# Patient Record
Sex: Female | Born: 1980 | Hispanic: No | Smoking: Former smoker
Health system: Southern US, Community
[De-identification: ages and names within clinical notes are randomized; demographics above are authoritative.]

## PROBLEM LIST (undated history)

## (undated) ENCOUNTER — Emergency Department (HOSPITAL_COMMUNITY): Disposition: A | Discharge status: Home / Self Care | Payer: Medicaid Other

## (undated) DIAGNOSIS — M5481 Occipital neuralgia: Secondary | ICD-10-CM

## (undated) DIAGNOSIS — J3489 Other specified disorders of nose and nasal sinuses: Secondary | ICD-10-CM

## (undated) DIAGNOSIS — G43909 Migraine, unspecified, not intractable, without status migrainosus: Secondary | ICD-10-CM

## (undated) DIAGNOSIS — G5600 Carpal tunnel syndrome, unspecified upper limb: Secondary | ICD-10-CM

## (undated) DIAGNOSIS — R002 Palpitations: Secondary | ICD-10-CM

## (undated) DIAGNOSIS — M436 Torticollis: Secondary | ICD-10-CM

## (undated) DIAGNOSIS — E559 Vitamin D deficiency, unspecified: Secondary | ICD-10-CM

## (undated) DIAGNOSIS — R7303 Prediabetes: Secondary | ICD-10-CM

## (undated) HISTORY — DX: Occipital neuralgia: M54.81

## (undated) HISTORY — DX: Carpal tunnel syndrome, unspecified upper limb: G56.00

## (undated) HISTORY — DX: Palpitations: R00.2

## (undated) HISTORY — DX: Migraine, unspecified, not intractable, without status migrainosus: G43.909

## (undated) HISTORY — DX: Prediabetes: R73.03

## (undated) HISTORY — DX: Other specified disorders of nose and nasal sinuses: J34.89

## (undated) HISTORY — DX: Torticollis: M43.6

## (undated) HISTORY — DX: Vitamin D deficiency, unspecified: E55.9

---

## 2002-04-08 ENCOUNTER — Inpatient Hospital Stay (HOSPITAL_COMMUNITY): Admission: AD | Admit: 2002-04-08 | Discharge: 2002-04-10 | Payer: Self-pay | Admitting: Obstetrics

## 2007-11-20 HISTORY — PX: TUBAL LIGATION: SHX77

## 2011-11-01 DIAGNOSIS — J029 Acute pharyngitis, unspecified: Secondary | ICD-10-CM | POA: Insufficient documentation

## 2011-11-02 ENCOUNTER — Emergency Department (HOSPITAL_COMMUNITY)
Admission: EM | Admit: 2011-11-02 | Discharge: 2011-11-02 | Disposition: A | Discharge status: Home / Self Care | Payer: Medicaid Other | Attending: Emergency Medicine | Admitting: Emergency Medicine

## 2011-11-02 ENCOUNTER — Encounter: Payer: Self-pay | Admitting: *Deleted

## 2011-11-02 DIAGNOSIS — J029 Acute pharyngitis, unspecified: Secondary | ICD-10-CM

## 2011-11-02 MED ORDER — IBUPROFEN 800 MG PO TABS: 800.0000 mg | ORAL_TABLET | Freq: Three times a day (TID) | ORAL | Status: AC

## 2011-11-02 MED ORDER — AMOXICILLIN 500 MG PO CAPS
1000.0000 mg | ORAL_CAPSULE | Freq: Once | ORAL | Status: AC
  Administered 2011-11-02: 1000 mg via ORAL
  Filled 2011-11-02: qty 2

## 2011-11-02 MED ORDER — PREDNISONE 20 MG PO TABS
40.0000 mg | ORAL_TABLET | Freq: Once | ORAL | Status: AC
  Administered 2011-11-02: 40 mg via ORAL
  Filled 2011-11-02: qty 2

## 2011-11-02 MED ORDER — AMOXICILLIN 500 MG PO CAPS: 500.0000 mg | ORAL_CAPSULE | Freq: Three times a day (TID) | ORAL | Status: AC

## 2011-11-02 NOTE — ED Provider Notes (Signed)
History     CSN: [id number] Arrival date & time: 11/02/2011 12:02 AM   First MD Initiated Contact with Patient 11/02/11 0253      Chief Complaint  Patient presents with  . Sore Throat   patient has had sore throat for 2 days. Temperature of 99 in triage. Patient has no other medical problems. She is also had a mild cough. No chest pain. No vomiting. No other problems at this time  (Consider location/radiation/quality/duration/timing/severity/associated sxs/prior treatment) HPI  History reviewed. No pertinent past medical history.  History reviewed. No pertinent past surgical history.  History reviewed. No pertinent family history.  History  Substance Use Topics  . Smoking status: Never Smoker   . Smokeless tobacco: Not on file  . Alcohol Use: Yes    OB History    Grav Para Term Preterm Abortions TAB SAB Ect Mult Living                  Review of Systems  All other systems reviewed and are negative.    Allergies  Macrobid  Home Medications  No current outpatient prescriptions on file.  BP 127/80  Pulse 80  Temp(Src) 99 F (37.2 C) (Oral)  Resp 20  SpO2 100%  Physical Exam  Nursing note and vitals reviewed. Constitutional: She appears well-developed and well-nourished. No distress.  HENT:  Head: Normocephalic.       Tonsils are beefy and red. Trace exudate on the right tonsillar pillar. Uvula is midline. There is no abscess or odor. Mucous membranes are moist. No adenopathy.  Eyes: Pupils are equal, round, and reactive to light.  Cardiovascular: Normal heart sounds.   Pulmonary/Chest: Breath sounds normal.  Abdominal: Soft.  Musculoskeletal: Normal range of motion.  Neurological: She is alert.  Skin: Skin is warm and dry.    ED Course  Procedures (including critical care time)   Labs Reviewed  RAPID STREP SCREEN   No results found.   No diagnosis found.    MDM  Pt is seen and examined;  Initial history and physical completed.  Will  follow.          Thos Matsumoto A. Patrica Duel, MD 11/02/11 [phone]

## 2011-11-02 NOTE — ED Notes (Signed)
Pt in c/o sore throat x2 days, increased swelling today

## 2012-01-08 ENCOUNTER — Emergency Department (HOSPITAL_COMMUNITY)
Admission: EM | Admit: 2012-01-08 | Discharge: 2012-01-09 | Disposition: A | Discharge status: Home / Self Care | Payer: Self-pay | Attending: Emergency Medicine | Admitting: Emergency Medicine

## 2012-01-08 DIAGNOSIS — J029 Acute pharyngitis, unspecified: Secondary | ICD-10-CM | POA: Insufficient documentation

## 2012-01-09 ENCOUNTER — Encounter (HOSPITAL_COMMUNITY): Payer: Self-pay | Admitting: Adult Health

## 2012-01-09 LAB — RAPID STREP SCREEN (MED CTR MEBANE ONLY): Streptococcus, Group A Screen (Direct): NEGATIVE

## 2012-01-09 MED ORDER — HYDROCODONE-ACETAMINOPHEN 7.5-325 MG/15ML PO SOLN: 15.0000 mL | Freq: Three times a day (TID) | ORAL | Status: AC | PRN

## 2012-01-09 MED ORDER — NAPROXEN 500 MG PO TABS: 500.0000 mg | ORAL_TABLET | Freq: Two times a day (BID) | ORAL | Status: DC

## 2012-01-09 NOTE — Discharge Instructions (Signed)
Your strep test is normal, please take Naprosyn or ibuprofen for pain, hydrocodone suspension for severe pain, drink plenty of fluids and followup with her Dr. as needed.  RESOURCE GUIDE  Dental Problems  Patients with Medicaid: [facility] [phone] [address]                                           [phone] [address] Phone:  [phone]                                                  Phone:  [phone]  If unable to pay or uninsured, contact:  [facility] or [facility]. to become qualified for the adult dental clinic.  Chronic Pain Problems Contact [facility]  [phone] Patients need to be referred by their primary care doctor.  Insufficient Money for Medicine Contact [employer]:  call "211" or [facility] 410-051-6191.  No Primary Care Doctor Call [facility]  [phone] Other agencies that provide inexpensive medical care    [facility]  [phone]    [facility]  [phone]    [facility]  [phone]    [facility]  [phone]    [facility]  [phone]    [facility]  [phone]  Psychological Services [facility]  [phone] [facility] Services  [phone] [facility]   [phone] (emergency services [phone])  Substance Abuse Resources [facility]  [phone] [facility] [phone] [facility] [place] [phone] Floydene Flock [phone] Residential & Outpatient Substance Abuse Program  [phone]  Abuse/Neglect [facility] Child Abuse Hotline [phone] [facility] Child Abuse Hotline [phone] (After Hours)  Emergency Shelter [facility] [phone]  Maternity Homes [facility] [place] [facility] [place] 6032099364 [facility] [phone]  MRSA Hotline #:   404 072 3315    [facility]  Resources  [facility] [place]     [employer]                          [facility] Dept. [address] [place]                       [address]      [address]  [place]                                                Cristobal Goldmann Phone:  [phone]                                   Phone:  [phone]                 Phone:  [phone]  [facility] Phone:  [phone]  [facility] Child Abuse Hotline 878-404-7626 [phone] (After Hours)

## 2012-01-09 NOTE — ED Notes (Signed)
Sore throat began yesterday associated with cough. Denies rhinorrhea, denies nausea. Redness and exudate noted.

## 2012-01-09 NOTE — ED Provider Notes (Signed)
History     CSN: [id number]  Arrival date & time 01/08/12  2348   First MD Initiated Contact with Patient 01/09/12 [phone]      Chief Complaint  Patient presents with  . Sore Throat    (Consider location/radiation/quality/duration/timing/severity/associated sxs/prior treatment) HPI Comments: 31 year old female with a history of approximately 24 hours of sore throat. It is constant, mild to moderate, worse with swallowing and not associated with fevers chills nausea vomiting. She does have a mild cough. She does not have a known sick contacts. She has had strep pharyngitis in the past year. She is in no medications prior to arrival and has no other major past medical history.  Patient is a 31 y.o. female presenting with pharyngitis. The history is provided by the patient.  Sore Throat    History reviewed. No pertinent past medical history.  History reviewed. No pertinent past surgical history.  History reviewed. No pertinent family history.  History  Substance Use Topics  . Smoking status: Never Smoker   . Smokeless tobacco: Not on file  . Alcohol Use: Yes    OB History    Grav Para Term Preterm Abortions TAB SAB Ect Mult Living                  Review of Systems  Constitutional: Negative for fever and chills.  HENT: Positive for sore throat.   Respiratory: Positive for cough.     Allergies  Macrobid  Home Medications   Current Outpatient Rx  Name Route Sig Dispense Refill  . HYDROCODONE-ACETAMINOPHEN 7.5-325 MG/15ML PO SOLN Oral Take 15 mLs by mouth every 8 (eight) hours as needed for pain. 120 mL 0  . NAPROXEN 500 MG PO TABS Oral Take 1 tablet (500 mg total) by mouth 2 (two) times daily with a meal. 30 tablet 0    BP 122/77  Pulse 90  Temp(Src) 98.8 F (37.1 C) (Oral)  Resp 18  SpO2 99%  Physical Exam  Nursing note and vitals reviewed. Constitutional: She appears well-developed and well-nourished. No distress.  HENT:  Head: Normocephalic and  atraumatic.  Mouth/Throat: Oropharynx is clear and moist. No oropharyngeal exudate.       Bilateral tonsillar hypertrophy and erythema with mild scattered exudate no asymmetry, uvula is midline  Eyes: Conjunctivae are normal. No scleral icterus.  Neck: Normal range of motion. Neck supple. No thyromegaly present.  Cardiovascular: Normal rate and regular rhythm.   Pulmonary/Chest: Effort normal and breath sounds normal.  Lymphadenopathy:    She has no cervical adenopathy.  Neurological: She is alert.  Skin: Skin is warm and dry. No rash noted. She is not diaphoretic.    ED Course  Procedures (including critical care time)   Labs Reviewed  RAPID STREP SCREEN   No results found.   1. Pharyngitis       MDM  No lymphadenopathy, no fever, no tachycardia, no change in phonation, swallowing with minimal difficulty. Strep pharyngitis rule out with bedside rapid strep, patient informed of results and encouraged to followup closely. She declines medications in the emergency department  Discharge Prescriptions include:  #1 Naprosyn #2 hydrocodone suspension        Vida Roller, MD 01/09/12 [phone]

## 2012-06-20 ENCOUNTER — Emergency Department (HOSPITAL_COMMUNITY): Payer: Self-pay

## 2012-06-20 ENCOUNTER — Emergency Department (HOSPITAL_COMMUNITY)
Admission: EM | Admit: 2012-06-20 | Discharge: 2012-06-21 | Disposition: A | Discharge status: Home / Self Care | Payer: Self-pay | Attending: Emergency Medicine | Admitting: Emergency Medicine

## 2012-06-20 ENCOUNTER — Encounter (HOSPITAL_COMMUNITY): Payer: Self-pay | Admitting: Emergency Medicine

## 2012-06-20 DIAGNOSIS — M25539 Pain in unspecified wrist: Secondary | ICD-10-CM | POA: Insufficient documentation

## 2012-06-20 NOTE — ED Notes (Signed)
Patient states that she fell down the stairs and has pain to her left wist. Swelling noted. The patient reports that no LOC or injuries other than wrist

## 2012-06-21 MED ORDER — IBUPROFEN 800 MG PO TABS
800.0000 mg | ORAL_TABLET | Freq: Once | ORAL | Status: AC
  Administered 2012-06-21: 800 mg via ORAL
  Filled 2012-06-21: qty 1

## 2012-06-21 MED ORDER — OXYCODONE-ACETAMINOPHEN 5-325 MG PO TABS: 1.0000 | ORAL_TABLET | ORAL | Status: AC | PRN

## 2012-06-21 NOTE — ED Provider Notes (Signed)
History     CSN: [id number]  Arrival date & time 06/20/12  2159   First MD Initiated Contact with Patient 06/20/12 2359      Chief Complaint  Patient presents with  . Fall  . Wrist Pain    (Consider location/radiation/quality/duration/timing/severity/associated sxs/prior treatment) Patient is a 31 y.o. female presenting with fall and wrist pain. The history is provided by the patient. No language interpreter was used.  Fall The accident occurred 3 to 5 hours ago. The fall occurred while walking (fell down wet steps and landed on L wrist). Distance fallen: 3 steps. She landed on a hard floor. There was no blood loss. The point of impact was the left wrist. The pain is present in the left wrist. The pain is at a severity of 8/10. The pain is moderate. She was ambulatory at the scene. There was no entrapment after the fall. There was no drug use involved in the accident. There was no alcohol use involved in the accident. Pertinent negatives include no fever, no numbness, no nausea, no vomiting and no loss of consciousness. The symptoms are aggravated by flexion. She has tried acetaminophen for the symptoms. The treatment provided mild relief.  Wrist Pain Pertinent negatives include no fever, nausea, numbness or vomiting.  31yo female reports falling down 3 stairs landing on her L wrist.  Pain with ROM.  No prior injuries to the wrist.  +CMS .  No deformity.  History reviewed. No pertinent past medical history.  History reviewed. No pertinent past surgical history.  History reviewed. No pertinent family history.  History  Substance Use Topics  . Smoking status: Never Smoker   . Smokeless tobacco: Not on file  . Alcohol Use: Yes    OB History    Grav Para Term Preterm Abortions TAB SAB Ect Mult Living                  Review of Systems  Constitutional: Negative.  Negative for fever.  HENT: Negative.   Eyes: Negative.   Respiratory: Negative.   Cardiovascular: Negative.     Gastrointestinal: Negative.  Negative for nausea and vomiting.  Musculoskeletal:       Left wrist pain  Neurological: Negative.  Negative for loss of consciousness and numbness.  Psychiatric/Behavioral: Negative.   All other systems reviewed and are negative.    Allergies  Nitrofurantoin monohyd macro and Nitrofurantoin monohyd macro  Home Medications   Current Outpatient Rx  Name Route Sig Dispense Refill  . NAPROXEN 500 MG PO TABS Oral Take 1 tablet (500 mg total) by mouth 2 (two) times daily with a meal. 30 tablet 0    BP 132/77  Pulse 81  Temp 98.7 F (37.1 C) (Oral)  Resp 16  SpO2 100%  LMP 05/29/2012  Physical Exam  Nursing note and vitals reviewed. Constitutional: She is oriented to person, place, and time. She appears well-developed and well-nourished.  HENT:  Head: Normocephalic and atraumatic.  Eyes: Conjunctivae and EOM are normal. Pupils are equal, round, and reactive to light.  Neck: Normal range of motion. Neck supple.  Cardiovascular: Normal rate.   Pulmonary/Chest: Effort normal.  Abdominal: Soft.  Genitourinary: Vaginal discharge: L wrist tenderness.  Musculoskeletal: Normal range of motion. She exhibits tenderness. She exhibits no edema.       L wrist tenderness no deformity.  +cms  Neurological: She is alert and oriented to person, place, and time. She has normal reflexes.  Skin: Skin is warm and dry.  Psychiatric: She has a normal mood and affect.    ED Course  Procedures (including critical care time)  Labs Reviewed - No data to display Dg Wrist Complete Left  06/20/2012  *RADIOLOGY REPORT*  Clinical Data: Fall with left wrist pain.  LEFT WRIST - COMPLETE 3+ VIEW  Comparison: None  Findings: No evidence of acute fracture, subluxation or dislocation identified.  No radio-opaque foreign bodies are present.  No focal bony lesions are noted.  The joint spaces are unremarkable.  IMPRESSION: No evidence of acute bony abnormality.  Original Report  Authenticated By: Rosendo Gros, M.D.     No diagnosis found.    MDM  L wrist pain after fall.  +cms.  Films negative for fx.  Wrist splint applied.  Ice and elevate x 24 hours.  Follow up with ortho if not better.  Percocet and ibuprofen for pain.  Return if loss of sensation or severe pain.         Remi Haggard, NP 06/22/12 1343

## 2012-06-23 NOTE — ED Provider Notes (Signed)
Medical screening examination/treatment/procedure(s) were performed by non-physician practitioner and as supervising physician I was immediately available for consultation/collaboration.   Delaila Nand, MD 06/23/12 1446 

## 2013-07-15 ENCOUNTER — Ambulatory Visit: Payer: Self-pay | Admitting: Obstetrics & Gynecology

## 2013-08-03 ENCOUNTER — Ambulatory Visit: Payer: Self-pay | Admitting: Obstetrics & Gynecology

## 2013-09-17 ENCOUNTER — Ambulatory Visit: Payer: Medicaid Other | Admitting: Obstetrics

## 2013-10-22 ENCOUNTER — Ambulatory Visit (HOSPITAL_COMMUNITY)
Admission: RE | Admit: 2013-10-22 | Discharge: 2013-10-22 | Disposition: A | Discharge status: Home / Self Care | Payer: Medicaid Other | Source: Ambulatory Visit | Attending: Internal Medicine | Admitting: Internal Medicine

## 2013-10-22 ENCOUNTER — Other Ambulatory Visit (HOSPITAL_COMMUNITY): Payer: Self-pay | Admitting: Internal Medicine

## 2013-10-22 DIAGNOSIS — M79641 Pain in right hand: Secondary | ICD-10-CM

## 2013-10-22 DIAGNOSIS — M79609 Pain in unspecified limb: Secondary | ICD-10-CM | POA: Insufficient documentation

## 2013-11-25 ENCOUNTER — Emergency Department (HOSPITAL_COMMUNITY): Payer: Medicaid Other

## 2013-11-25 ENCOUNTER — Encounter (HOSPITAL_COMMUNITY): Payer: Self-pay | Admitting: Emergency Medicine

## 2013-11-25 ENCOUNTER — Emergency Department (HOSPITAL_COMMUNITY)
Admission: EM | Admit: 2013-11-25 | Discharge: 2013-11-25 | Disposition: A | Discharge status: Home / Self Care | Payer: Medicaid Other | Attending: Emergency Medicine | Admitting: Emergency Medicine

## 2013-11-25 DIAGNOSIS — R5383 Other fatigue: Secondary | ICD-10-CM

## 2013-11-25 DIAGNOSIS — X503XXA Overexertion from repetitive movements, initial encounter: Secondary | ICD-10-CM | POA: Insufficient documentation

## 2013-11-25 DIAGNOSIS — Y9389 Activity, other specified: Secondary | ICD-10-CM | POA: Insufficient documentation

## 2013-11-25 DIAGNOSIS — R5381 Other malaise: Secondary | ICD-10-CM | POA: Insufficient documentation

## 2013-11-25 DIAGNOSIS — R52 Pain, unspecified: Secondary | ICD-10-CM | POA: Insufficient documentation

## 2013-11-25 DIAGNOSIS — G8929 Other chronic pain: Secondary | ICD-10-CM | POA: Insufficient documentation

## 2013-11-25 DIAGNOSIS — M25511 Pain in right shoulder: Secondary | ICD-10-CM

## 2013-11-25 DIAGNOSIS — Y9289 Other specified places as the place of occurrence of the external cause: Secondary | ICD-10-CM | POA: Insufficient documentation

## 2013-11-25 DIAGNOSIS — S46909A Unspecified injury of unspecified muscle, fascia and tendon at shoulder and upper arm level, unspecified arm, initial encounter: Secondary | ICD-10-CM | POA: Insufficient documentation

## 2013-11-25 DIAGNOSIS — Y990 Civilian activity done for income or pay: Secondary | ICD-10-CM | POA: Insufficient documentation

## 2013-11-25 DIAGNOSIS — X58XXXA Exposure to other specified factors, initial encounter: Secondary | ICD-10-CM

## 2013-11-25 DIAGNOSIS — S4980XA Other specified injuries of shoulder and upper arm, unspecified arm, initial encounter: Secondary | ICD-10-CM | POA: Insufficient documentation

## 2013-11-25 MED ORDER — MELOXICAM 7.5 MG PO TABS: 15.0000 mg | ORAL_TABLET | Freq: Every day | ORAL | Status: DC

## 2013-11-25 MED ORDER — KETOROLAC TROMETHAMINE 60 MG/2ML IM SOLN
60.0000 mg | Freq: Once | INTRAMUSCULAR | Status: AC
  Administered 2013-11-25: 60 mg via INTRAMUSCULAR
  Filled 2013-11-25: qty 2

## 2013-11-25 NOTE — ED Notes (Signed)
Pt states that she has repetitive motion at work and has had trouble with her right arm and shoulder.  With pain down the back of her arm.  States pain in this arm x 1.5 yrs.

## 2013-11-25 NOTE — ED Provider Notes (Signed)
CSN: [id number]     Arrival date & time 11/25/13  1014 History   First MD Initiated Contact with Patient 11/25/13 1028     Chief Complaint  Patient presents with  . Arm Pain   (Consider location/radiation/quality/duration/timing/severity/associated sxs/prior Treatment) Patient is a 33 y.o. female presenting with arm pain.  Arm Pain Associated symptoms include arthralgias and weakness (Right arm secondary to pain). Pertinent negatives include no neck pain or rash.   33 yo female presents with acute on chronic RIGHT shoulder pain. 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 Patient reports hx of repetitive over the head arm activities at work. Patient reports 1 year hx of chronic RIGHT shoulder pain worse with activities above shoulder height. Patient reports recent exacerbation since Monday with associated hand swelling. Patient [facility] when she stepped in a whole on Tuesday. Patient pain described as heaviness and weakness secondary to the pain, with sharp pains if she exceeds 90 degrees shoulder flexion. Patient denies radiation. Takes meloxicam intermittently for pain, has taken once in the past week. Denies fever/chills, admits to intermittent numbness of skin overlying triceps area, that has resolved today. Denies color change, denies past surgeries.  History reviewed. No pertinent past medical history. No past surgical history on file. No family history on file. History  Substance Use Topics  . Smoking status: Never Smoker   . Smokeless tobacco: Not on file  . Alcohol Use: Yes   OB History   Grav Para Term Preterm Abortions TAB SAB Ect Mult Living                 Review of Systems  Musculoskeletal: Positive for arthralgias. Negative for back pain, gait problem, neck pain and neck stiffness.  Skin: Negative for color change, pallor, rash and wound.  Neurological: Positive for weakness (Right arm secondary to pain).  All other systems reviewed and are  negative.    Allergies  Nitrofurantoin monohyd macro and Nitrofurantoin monohyd macro  Home Medications   Current Outpatient Rx  Name  Route  Sig  Dispense  Refill  . meloxicam (MOBIC) 7.5 MG tablet   Oral   Take 2 tablets (15 mg total) by mouth daily.   30 tablet   0    BP 148/79  Pulse 82  Temp(Src) 98.8 F (37.1 C) (Oral)  Resp 16  SpO2 100%  LMP 10/25/2013 Physical Exam  Nursing note and vitals reviewed. Constitutional: She is oriented to person, place, and time. She appears well-developed and well-nourished. No distress.  HENT:  Head: Normocephalic and atraumatic.  Eyes: Conjunctivae and EOM are normal.  Neck: Normal range of motion. Neck supple.  Cardiovascular: Normal rate and regular rhythm.  Exam reveals no gallop and no friction rub.   No murmur heard. Good radial pulses bilaterally and equal.   Pulmonary/Chest: Effort normal and breath sounds normal. No respiratory distress. She has no wheezes. She has no rales.  Musculoskeletal: Normal range of motion.  RIGHT SHOULDER/Upper extremity: Positive impingement signs. No sulcus sign noted with longitudinal traction of RIght  Arm. Pain with resisted biceps flexion. No pain with resisted supination at wrist. Proximal biceps tendon tenderness to palpation. No bony tenderness of shoulder girdle. Pain with active and passive ROM above shoulder height. Patient has pain with placing right hand behind her back. No evident bruising or swelling noted.   Cervical midline tenderness on exam rated 5/10. No paraspinal tenderness. Normal ROM of neck/head   Neurological: She is alert and oriented  to person, place, and time.  Good sensation of hands bilaterally and upper arm bilaterally.   Skin: Skin is warm and dry. She is not diaphoretic.  Psychiatric: She has a normal mood and affect. Her behavior is normal.    ED Course  Procedures (including critical care time) Labs Review Labs Reviewed - No data to display Imaging  Review Dg Cervical Spine Complete  11/25/2013   CLINICAL DATA:  Chronic neck and right shoulder pain  EXAM: CERVICAL SPINE  4+ VIEWS  COMPARISON:  None.  FINDINGS: Normal alignment. Negative for fracture. C5-6 degenerative changes and spondylosis. Endplate bony spurring noted anteriorly at this level. Preserved vertebral body heights and disc spaces. Facets aligned. Foramina appear patent. Intact odontoid. Lung apices are clear.  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   Electronically Signed   By: Ruel Favors M.D.   On: 11/25/2013 11:54   Dg Shoulder Right  11/25/2013   CLINICAL DATA:  Chronic, worsening right shoulder pain.  EXAM: RIGHT SHOULDER - 2+ VIEW  COMPARISON:  None.  FINDINGS: No acute bony or joint abnormality is identified. Mild acromioclavicular degenerative change is noted. Imaged lung parenchyma and ribs are unremarkable.  IMPRESSION: No acute finding. Mild appearing acromioclavicular degenerative disease.   Electronically Signed   By: Drusilla Kanner M.D.   On: 11/25/2013 11:53   a EKG Interpretation   None       MDM   1. Shoulder pain, right   Plain films show Mild C5-C6 degenerative change. Mild AC joint degenerative disease.   Suspect patient may have possible RIGHT proximal biceps tendonitis with associated subacromial bursitis. Due to complexity of shoulder joint and patient's recent FOOSH. Recommend patient follow up with Orthopedics. Advise call today to make appointment ASAP. May need further testing. Patient confirms understanding. Discharged in good condition.      Meds given in ED:  Medications  ketorolac (TORADOL) injection 60 mg (60 mg Intramuscular Given 11/25/13 1143)    Discharge Medication List as of 11/25/2013 10:52 AM    START taking these medications   Details  meloxicam (MOBIC) 7.5 MG tablet Take 2 tablets (15 mg total) by mouth daily., Starting 11/25/2013, Until Discontinued, Print         Rudene Anda, PA-C 11/26/13 1308

## 2013-11-25 NOTE — Discharge Instructions (Signed)
Call Orthopedic office today to schedule follow up for acute on chronic RIGHT shoulder pain, contact information provided above. Start taking Meloxicam daily as prescribed. Take medication with food to prevent upset stomach. Recommend rest, elevation on pillow, with daily ice to affected joint following daily activities.    Emergency Department Resource Guide 1) Find a Doctor and Pay Out of Pocket Although you won't have to find out who is covered by your insurance plan, it is a good idea to ask around and get recommendations. You will then need to call the office and see if the doctor you have chosen will accept you as a new patient and what types of options they offer for patients who are self-pay. Some doctors offer discounts or will set up payment plans for their patients who do not have insurance, but you will need to ask so you aren't surprised when you get to your appointment.  2) Contact Your Local Health Department Not all health departments have doctors that can see patients for sick visits, but many do, so it is worth a call to see if yours does. If you don't know where your local health department is, you can check in your phone book. The CDC also has a tool to help you locate your state's health department, and many state websites also have listings of all of their local health departments.  3) Find a Walk-in Clinic If your illness is not likely to be very severe or complicated, you may want to try a walk in clinic. These are popping up all over the country in pharmacies, drugstores, and shopping centers. They're usually staffed by nurse practitioners or physician assistants that have been trained to treat common illnesses and complaints. They're usually fairly quick and inexpensive. However, if you have serious medical issues or chronic medical problems, these are probably not your best option.  No Primary Care Doctor: - Call Health Connect at  [phone] - they can help you locate a  primary care doctor that  accepts your insurance, provides certain services, etc. - Physician Referral Service- [phone]  Chronic Pain Problems: Organization         Address  Phone   Notes  [facility]  [phone]) [phone] Patients need to be referred by their primary care doctor.   Medication Assistance: Organization         Address  Phone   Notes  [facility] [address] [place], [place] [address] [phone]) [phone] --Must be a resident of [facility] [place] -- Must have NO insurance coverage whatsoever (no Medicaid/ Medicare, etc.) -- The pt. MUST have a primary care doctor that directs their care regularly and follows them in the community   Med[facility]ist  [phone]   [employer]  [phone]    Agencies that provide inexpensive medical care: Organization         Address  Phone   Notes  [facility]  [phone]) [phone]   [facility]    [phone]) [phone]   [facility] [address] [place], [place] [address] [phone]) [phone]   [facility] [address], [place] [phone]) [phone]   [facility]anned [facility]    [phone]   [facility]    [phone]) [phone]   [facility]  [address], [place] Phone:  [phone](336) [phone], Fax:  [phone]) [phone] Hours of Operation:  9 am -  6 pm, M-F.  Also accepts Medicaid/Medicare and self-pay.  [facility] for [place], [address], [place] Phone: [phone], Fax: [phone]. Hours of Operation:  8:30 am - 5:30 pm, M-F.  Also accepts Medicaid and self-pay.  [facility] [place] [address] [place] Phone: [phone]   [place], [place], [place] [phone], Ext. [phone] Mondays & Thursdays: 7-9 AM.  First 15 patients are seen on a first come, first serve basis.    [place]  Providers:  Organization         Address  Phone   Notes  [facility] [address],  [phone] Also accepts self-pay patients.  [facility] [id number] [place], [place]  [phone]   [place], [address], [place] [phone]   [facility] [address], [place] [phone]   Lucianne Lei [address], [place]   [phone] Only accepts [place] Access [place] patients after they have their name applied to their card.   Self-Pay (no insurance) in [facility]:  Organization         Address  Phone   Notes  Sickle Cell Patients, [facility] [place] [phone]   [facility] [phone]   [facility] [place]  [place], [place], [place] [phone]   [facility]/Dr. Osei-Bonsu  [address], [place] or [address], [place] [phone] Phone number for both [place] and [place] locations is the same.  [facility] [address] [place] [phone]   [facility] [address] [place] or [address] [phone]   [facility] [address] [place] [phone], phone; [phone], fax Sees patients 1st and 3rd Saturday of every month.  Must not qualify for public or private insurance (i.e. Medicaid, Medicare, [place] Health Choice, Veterans' Benefits)  Household income should be no more than 200% of the poverty level The clinic cannot treat you if you are pregnant or think you are pregnant  Sexually transmitted diseases are not treated at the clinic.    Dental Care: Organization         Address  Phone  Notes  [facility] Department of [facility] [phone] Accepts children up to age 12 who are enrolled in [place] or [employer]; pregnant women with a Medicaid card; and children who have applied for Medicaid or [place] Health Choice, but were declined, whose parents can pay a reduced fee at time of service.  [facility] of [facility]  [address], [place] [phone] Accepts children up to age 3 who are enrolled in [place] or [place]; pregnant women with a Medicaid card; and children who have applied for Medicaid or [place] Health Choice, but were declined, whose parents can pay a reduced fee at time of service.  [facility]  [place] [phone] Patients are seen by appointment only. Walk-ins are not accepted. [place] will see patients 73 years of age and  older. Monday - Tuesday (8am-5pm) Most Wednesdays (8:30-5pm) $30 per visit, cash only  [facility] Adult [employer] PROGRAM  [address], [facility] [phone] Patients are seen by appointment only. Walk-ins are not accepted. [place] will see patients 36 years of age and older. One Wednesday Evening (Monthly: Volunteer Based).  $30 per visit, cash only  [place]  [phone] for adults; Children under age 34, call Graduate Pediatric Dentistry at [phone]. Children aged 22-14, please call [phone] to request a pediatric application.  Dental services are provided in all areas of dental care including fillings, crowns and bridges, complete and partial dentures, implants, gum treatment, root canals, and extractions. Preventive care is also provided. Treatment is provided to both adults and children. Patients are selected via a lottery and there is often a waiting list.   [facility] [address] [place]  [phone] [email or web address]   [facility]  [address] [place], [place] [phone], Ext. [phone] Second and Fourth Thursday of each month, opens at 6:30 AM; Clinic ends at 9 AM.  Patients are seen on a first-come first-served basis, and a limited number are seen during each clinic.   [facility]  [address] Hillard Danker [place], [place] [phone]   Eligibility Requirements You must have lived in [place], [place], or [place] counties for at least the last three months.   You cannot be eligible for state or federal sponsored [employer], including [employer], [place], or [employer].   You generally cannot be eligible for healthcare insurance through your employer.    How to apply: Eligibility screenings are held every Tuesday and Wednesday afternoon from 1:00 pm until 4:00 pm. You do not need an appointment for the interview!  [facility] [address] [place], [place]   [place]  [place] Department  [place]  [phone]    Behavioral Health Resources in the Community: Intensive Outpatient Programs Organization         Address  Phone  Notes  [place]. [address], [place], [place] [phone]   [facility] [address] [place], [place]   ADS: Alcohol & Drug Svcs [address], [place], [place]   [place] [address],  [place], [place] or [phone]   Substance Abuse Resources Organization         Address  Phone  Notes  Alcohol and Drug Services  [phone]   [place]  [phone]   [facility] [place]   Chinita Pester  [phone]   Residential & Outpatient Substance Abuse Program  [phone]   Psychological Services Organization         Address  Phone  Notes  [facility]  [place]  [phone]   [place] [address] [place] [phone] or [phone]    Mobile Crisis Teams Organization         Address  Phone  Notes  [facility], Mobile Crisis Care Unit  [phone]   [facility]  [address] [place], [place]   [facility] [address], [place] [phone]    Self-Help/Support Groups Organization         Address  Phone             Notes  [facility]oc[facility] of [place] - variety of support groups  [phone] Call for more information  Narcotics Anonymous (NA), [facility]aring [facility] [address], [place]  2 meetings at this location   Statisticianesidential Treatment Programs Organization         Address  Phone  Notes  [facility] [address],    [place]  [phone]   [facility]  [address], [place] [phone], [place], [place] [phone]   [facility] [address], [place] [phone] Admissions: 8am-3pm M-F  [facility] [address]    [place], [place] [phone]   [facility] [address], [place], [place] [phone]   [facility] [address]  [place], [place] [phone]   [facility] - Intensive Outpatient [address], [place], [place] [phone]   [facility] ([facility]iction Recovery [facility] [address]  [place], [place] [phone] or [phone]   [facility] Services (RTS) [address] [place], [place] [phone] Accepts Medicaid  [facility] [address]  [place] [phone] Substance Abuse/Addiction Treatment   [facility] Behavioral Health Resources Organization         Address  Phone  Notes  [facility]  [phone]) [phone]   Angie FavaJulie Brannon, PhD [address], [address] [place], [place]   [phone]) [phone] or [phone]) [phone]    [facility]   [address] [place], [place] [phone]) [phone]   [facility] [address], [place], [place] [phone]) [phone] Insurance/Medicaid/sponsorship through [facility]  [facility] Families [address]                                    [place], [place] [phone]) [phone] Therapy/tele-psych/case  [facility] [address]   [place], [place] [phone]) [phone]    Dr. Lolly MustacheArfeen  [phone]   [facility]  [employer] [facility] 1) [address], [place] 2) [address], [place] 3)  [address], [place] [phone]) [phone]  [phone]) [phone]   [facility] Child Abuse Hotline [phone]) [phone] or [phone]) [phone] (After Hours)

## 2013-11-27 NOTE — ED Provider Notes (Signed)
Medical screening examination/treatment/procedure(s) were performed by non-physician practitioner and as supervising physician I was immediately available for consultation/collaboration.  EKG Interpretation   None         Karmela Bram E Ronalda [place], MD 11/27/13 1507 

## 2013-12-25 ENCOUNTER — Ambulatory Visit: Payer: Medicaid Other

## 2014-01-01 ENCOUNTER — Ambulatory Visit: Payer: Medicaid Other

## 2014-01-11 ENCOUNTER — Ambulatory Visit: Payer: Medicaid Other | Attending: Orthopedic Surgery

## 2014-01-11 DIAGNOSIS — M25519 Pain in unspecified shoulder: Secondary | ICD-10-CM | POA: Insufficient documentation

## 2014-01-11 DIAGNOSIS — M542 Cervicalgia: Secondary | ICD-10-CM | POA: Insufficient documentation

## 2014-01-11 DIAGNOSIS — IMO0001 Reserved for inherently not codable concepts without codable children: Secondary | ICD-10-CM | POA: Insufficient documentation

## 2014-08-19 ENCOUNTER — Encounter: Payer: Self-pay | Admitting: Obstetrics & Gynecology

## 2014-08-19 ENCOUNTER — Ambulatory Visit (INDEPENDENT_AMBULATORY_CARE_PROVIDER_SITE_OTHER): Payer: Medicaid Other | Admitting: Obstetrics & Gynecology

## 2014-08-19 VITALS — BP 127/79 | HR 81 | Temp 97.7°F | Ht 66.0 in | Wt 188.0 lb

## 2014-08-19 DIAGNOSIS — Z01419 Encounter for gynecological examination (general) (routine) without abnormal findings: Secondary | ICD-10-CM

## 2014-08-19 DIAGNOSIS — D069 Carcinoma in situ of cervix, unspecified: Secondary | ICD-10-CM | POA: Insufficient documentation

## 2014-08-19 DIAGNOSIS — Z0000 Encounter for general adult medical examination without abnormal findings: Secondary | ICD-10-CM

## 2014-08-19 NOTE — Patient Instructions (Signed)

## 2014-08-19 NOTE — Progress Notes (Signed)
Subjective:     Jocelyn Townsend is a 33 y.o. female here for a routine exam.  Current complaints: none.    Personal health questionnaire:  Is patient Ashkenazi Jewish, have a family history of breast and/or ovarian cancer: no Is there a family history of uterine cancer diagnosed at age < 9, gastrointestinal cancer, urinary tract cancer, family member who is a Personnel officer syndrome-associated carrier: no Is the patient overweight and hypertensive, family history of diabetes, personal history of gestational diabetes or PCOS: yes Is patient over 68, have PCOS,  family history of premature CHD under age 56, diabetes, smoke, have hypertension or peripheral artery disease:  no At any time, has a partner hit, kicked or otherwise hurt or frightened you?: no Over the past 2 weeks, have you felt down, depressed or hopeless?: no Over the past 2 weeks, have you felt little interest or pleasure in doing things?:no   Gynecologic History Patient's last menstrual period was 07/06/2014. Contraception: tubal ligation Last Pap: 3 yrs ago. Results were: normal   Obstetric History OB History  Gravida Para Term Preterm AB SAB TAB Ectopic Multiple Living  4 2 2  2 1 1   2     # Outcome Date GA Lbr Len/2nd Weight Sex Delivery Anes PTL Lv  4 TRM 01/05/08 [redacted]w[redacted]d  3.43 kg (7 lb 9 oz) F SVD EPI  Y  3 TRM 04/08/02 [redacted]w[redacted]d  4.224 kg (9 lb 5 oz) F SVD EPI  Y  2 SAB           1 TAB               History reviewed. No pertinent past medical history.  History reviewed. No pertinent past surgical history.  Current outpatient prescriptions:cholecalciferol (VITAMIN D) 1000 UNITS tablet, Take 1,000 Units by mouth daily., Disp: , Rfl: ;  doxycycline (ADOXA) 100 MG tablet, Take 100 mg by mouth 2 (two) times daily., Disp: , Rfl: ;  glucosamine-chondroitin 500-400 MG tablet, Take 1 tablet by mouth 3 (three) times daily., Disp: , Rfl: ;  naproxen (NAPROSYN) 500 MG tablet, Take 500 mg by mouth 2 (two) times daily with a meal., Disp: ,  Rfl:  traMADol (ULTRAM) 50 MG tablet, Take 50 mg by mouth every 6 (six) hours as needed., Disp: , Rfl:  Allergies  Allergen Reactions  . Nitrofurantoin Monohyd Macro   . Nitrofurantoin Monohyd Macro     History  Substance Use Topics  . Smoking status: Never Smoker   . Smokeless tobacco: Not on file  . Alcohol Use: No    History reviewed. No pertinent family history.    Review of Systems  Constitutional: negative for fatigue and weight loss Respiratory: negative for cough and wheezing Cardiovascular: negative for chest pain, fatigue and palpitations Gastrointestinal: negative for abdominal pain and change in bowel habits Musculoskeletal:negative for myalgias Neurological: negative for gait problems and tremors Behavioral/Psych: negative for abusive relationship, depression Endocrine: negative for temperature intolerance   Genitourinary:negative for abnormal menstrual periods, genital lesions, hot flashes, sexual problems and vaginal discharge Integument/breast: negative for breast lump, breast tenderness, nipple discharge and skin lesion(s)    Objective:       BP 127/79  Pulse 81  Temp(Src) 97.7 F (36.5 C)  Ht 5\' 6"  (1.676 m)  Wt 85.276 kg (188 lb)  BMI 30.36 kg/m2  LMP 07/06/2014 General:   alert  Skin:   no rash or abnormalities  Lungs:   clear to auscultation bilaterally  Heart:   regular  rate and rhythm, S1, S2 normal, no murmur, click, rub or gallop  Breasts:   normal without suspicious masses, skin or nipple changes or axillary nodes  Abdomen:  normal findings: no organomegaly, soft, non-tender and no hernia  Pelvis:  External genitalia: normal general appearance Urinary system: urethral meatus normal and bladder without fullness, nontender Vaginal: normal without tenderness, induration or masses Cervix: normal appearance Adnexa: normal bimanual exam Uterus: anteverted and non-tender, normal size   Lab Review  Labs reviewed no Radiologic studies reviewed  no     Assessment:    Healthy female exam.  H/O cervical dysplasia/LEEP   Plan:  Counseled re: annual Pap smears  Education reviewed: calcium supplements, low fat, low cholesterol diet, safe sex/STD prevention and weight bearing exercise. Contraception: tubal ligation.   Meds ordered this encounter  Medications  . traMADol (ULTRAM) 50 MG tablet    Sig: Take 50 mg by mouth every 6 (six) hours as needed.  . naproxen (NAPROSYN) 500 MG tablet    Sig: Take 500 mg by mouth 2 (two) times daily with a meal.  . cholecalciferol (VITAMIN D) 1000 UNITS tablet    Sig: Take 1,000 Units by mouth daily.  [place] Kitchen. glucosamine-chondroitin 500-400 MG tablet    Sig: Take 1 tablet by mouth 3 (three) times daily.  [place] Kitchen. doxycycline (ADOXA) 100 MG tablet    Sig: Take 100 mg by mouth 2 (two) times daily.    Follow up as needed.

## 2014-08-20 LAB — PAP IG (IMAGE GUIDED)

## 2014-09-20 ENCOUNTER — Encounter: Payer: Self-pay | Admitting: Obstetrics & Gynecology

## 2014-11-15 ENCOUNTER — Encounter: Payer: Self-pay | Admitting: *Deleted

## 2014-11-16 ENCOUNTER — Encounter: Payer: Self-pay | Admitting: Obstetrics & Gynecology

## 2015-08-02 ENCOUNTER — Institutional Professional Consult (permissible substitution): Payer: Medicaid Other | Admitting: Pulmonary Disease

## 2015-08-22 ENCOUNTER — Ambulatory Visit: Payer: Medicaid Other | Admitting: Obstetrics & Gynecology

## 2015-10-20 ENCOUNTER — Encounter: Payer: Self-pay | Admitting: Certified Nurse Midwife

## 2015-10-20 ENCOUNTER — Ambulatory Visit (INDEPENDENT_AMBULATORY_CARE_PROVIDER_SITE_OTHER): Payer: Medicaid Other | Admitting: Certified Nurse Midwife

## 2015-10-20 VITALS — BP 123/88 | HR 88 | Temp 98.1°F | Wt 192.0 lb

## 2015-10-20 DIAGNOSIS — Z0000 Encounter for general adult medical examination without abnormal findings: Secondary | ICD-10-CM | POA: Diagnosis not present

## 2015-10-20 DIAGNOSIS — Z113 Encounter for screening for infections with a predominantly sexual mode of transmission: Secondary | ICD-10-CM

## 2015-10-20 DIAGNOSIS — Z01419 Encounter for gynecological examination (general) (routine) without abnormal findings: Secondary | ICD-10-CM | POA: Diagnosis not present

## 2015-10-20 LAB — RPR

## 2015-10-20 NOTE — Patient Instructions (Signed)
Kegel Exercises  The goal of Kegel exercises is to isolate and exercise your pelvic floor muscles. These muscles act as a hammock that supports the rectum, vagina, small intestine, and uterus. As the muscles weaken, the hammock sags and these organs are displaced from their normal positions. Kegel exercises can strengthen your pelvic floor muscles and help you to improve bladder and bowel control, improve sexual response, and help reduce many problems and some discomfort during pregnancy. Kegel exercises can be done anywhere and at any time.  HOW TO PERFORM KEGEL EXERCISES  1. Locate your pelvic floor muscles. To do this, squeeze (contract) the muscles that you use when you try to stop the flow of urine. You will feel a tightness in the vaginal area (women) and a tight lift in the rectal area (men and women).  2. When you begin, contract your pelvic muscles tight for 2-5 seconds, then relax them for 2-5 seconds. This is one set. Do 4-5 sets with a short pause in between.  3. Contract your pelvic muscles for 8-10 seconds, then relax them for 8-10 seconds. Do 4-5 sets. If you cannot contract your pelvic muscles for 8-10 seconds, try 5-7 seconds and work your way up to 8-10 seconds. Your goal is 4-5 sets of 10 contractions each day.  Keep your stomach, buttocks, and legs relaxed during the exercises. Perform sets of both short and long contractions. Vary your positions. Perform these contractions 3-4 times per day. Perform sets while you are:    · Lying in bed in the morning.  · Standing at lunch.  · Sitting in the late afternoon.  · Lying in bed at night.   You should do 40-50 contractions per day. Do not perform more Kegel exercises per day than recommended. Overexercising can cause muscle fatigue. Continue these exercises for for at least 15-20 weeks or as directed by your caregiver.     This information is not intended to replace advice given to you by your health care provider. Make sure you discuss any questions  you have with your health care provider.     Document Released: 10/22/2012 Document Revised: 11/26/2014 Document Reviewed: 10/22/2012  Elsevier Interactive Patient Education ©2016 [employer]vier [employer].

## 2015-10-20 NOTE — Progress Notes (Signed)
Patient ID: Jocelyn Townsend, female   DOB: 24-Feb-1981, 34 y.o.   MRN: [id number]    Subjective:        Jocelyn Townsend is a 34 y.o. female here for a routine exam.  Current complaints: none.  Regular monthly periods, lasting 5 days, denies cramping or menorrhagia.  Does have cramping with her cycle, for the first 3 days.  Has not taken anything for the cramping.    Currently sexually active.    Personal health questionnaire:  Is patient Ashkenazi Jewish, have a family history of breast and/or ovarian cancer: yes, MGM had BCA dx when she was older Is there a family history of uterine cancer diagnosed at age < 4150, gastrointestinal cancer, urinary tract cancer, family member who is a Personnel officerLynch syndrome-associated carrier: no Is the patient overweight and hypertensive, family history of diabetes, personal history of gestational diabetes, preeclampsia or PCOS: yes Is patient over 1455, have PCOS,  family history of premature CHD under age 34, diabetes, smoke, have hypertension or peripheral artery disease:  no At any time, has a partner hit, kicked or otherwise hurt or frightened you?: no Over the past 2 weeks, have you felt down, depressed or hopeless?: no Over the past 2 weeks, have you felt little interest or pleasure in doing things?:no   Gynecologic History No LMP recorded. Patient is not currently having periods (Reason: Irregular Periods). Contraception: tubal ligation Last Pap: unknown. Results were: normal according to the patient, had leep procedure in 2003.   Last mammogram: N/A.   Obstetric History OB History  Gravida Para Term Preterm AB SAB TAB Ectopic Multiple Living  4 2 2  2 1 1   2     # Outcome Date GA Lbr Len/2nd Weight Sex Delivery Anes PTL Lv  4 Term 01/05/08 [id number]  7 lb 9 oz (3.43 kg) F Vag-Spont EPI  Y  3 Term 04/08/02 [id number]  9 lb 5 oz (4.224 kg) F Vag-Spont EPI  Y  2 SAB           1 TAB               History reviewed. No pertinent past medical history.  History  reviewed. No pertinent past surgical history.   Current outpatient prescriptions:  .  cholecalciferol (VITAMIN D) 1000 UNITS tablet, Take 1,000 Units by mouth daily., Disp: , Rfl:  .  glucosamine-chondroitin 500-400 MG tablet, Take 1 tablet by mouth 3 (three) times daily., Disp: , Rfl:  .  ibuprofen (ADVIL,MOTRIN) 800 MG tablet, Take 800 mg by mouth every 8 (eight) hours as needed., Disp: , Rfl:  Allergies  Allergen Reactions  . Nitrofurantoin Monohyd Macro   . Nitrofurantoin Monohyd Macro     Social History  Substance Use Topics  . Smoking status: Never Smoker   . Smokeless tobacco: Not on file  . Alcohol Use: No    History reviewed. No pertinent family history.    Review of Systems  Constitutional: negative for fatigue and weight loss Respiratory: negative for cough and wheezing Cardiovascular: negative for chest pain, fatigue and palpitations Gastrointestinal: negative for abdominal pain and change in bowel habits Musculoskeletal:negative for myalgias Neurological: negative for gait problems and tremors Behavioral/Psych: negative for abusive relationship, depression Endocrine: negative for temperature intolerance   Genitourinary:negative for abnormal menstrual periods, genital lesions, hot flashes, sexual problems and vaginal discharge Integument/breast: negative for breast lump, breast tenderness, nipple discharge and skin lesion(s)    Objective:       BP 123/88  mmHg  Pulse 88  Temp(Src) 98.1 F (36.7 C)  Wt 192 lb (87.091 kg) General:   alert  Skin:   no rash or abnormalities  Lungs:   clear to auscultation bilaterally  Heart:   regular rate and rhythm, S1, S2 normal, no murmur, click, rub or gallop  Breasts:   normal without suspicious masses, skin or nipple changes or axillary nodes  Abdomen:  normal findings: no organomegaly, soft, non-tender and no hernia  Pelvis:  External genitalia: normal general appearance Urinary system: urethral meatus normal and  bladder without fullness, nontender Vaginal: normal without tenderness, induration or masses Cervix: normal appearance Adnexa: normal bimanual exam Uterus: anteverted and non-tender, normal size   Lab Review Urine pregnancy test Labs reviewed yes Radiologic studies reviewed no  50% of 30 min visit spent on counseling and coordination of care.   Assessment:    Healthy female exam.   STD screening exam  Plan:    Education reviewed: calcium supplements, depression evaluation, low fat, low cholesterol diet, safe sex/STD prevention, self breast exams, skin cancer screening and weight bearing exercise. Contraception: tubal ligation. Follow up in: 1 year.   Meds ordered this encounter  Medications  . ibuprofen (ADVIL,MOTRIN) 800 MG tablet    Sig: Take 800 mg by mouth every 8 (eight) hours as needed.   Orders Placed This Encounter  Procedures  . SureSwab, Vaginosis/Vaginitis Plus  . HIV antibody (with reflex)  . Hepatitis B surface antigen  . RPR  . Hepatitis C antibody    Possible management options include: ultrasound if menses is worsened

## 2015-10-21 LAB — HIV ANTIBODY (ROUTINE TESTING W REFLEX): HIV 1&2 Ab, 4th Generation: NONREACTIVE

## 2015-10-21 LAB — HEPATITIS B SURFACE ANTIGEN: Hepatitis B Surface Ag: NEGATIVE

## 2015-10-21 LAB — HEPATITIS C ANTIBODY: HCV Ab: NEGATIVE

## 2015-10-23 LAB — SURESWAB, VAGINOSIS/VAGINITIS PLUS
Atopobium vaginae: NOT DETECTED Log (cells/mL)
C. albicans, DNA: NOT DETECTED
C. glabrata, DNA: NOT DETECTED
C. parapsilosis, DNA: NOT DETECTED
C. trachomatis RNA, TMA: NOT DETECTED
C. tropicalis, DNA: NOT DETECTED
Gardnerella vaginalis: <4.7 Log (cells/mL)
LACTOBACILLUS SPECIES: 7.3 Log (cells/mL)
MEGASPHAERA SPECIES: NOT DETECTED Log (cells/mL)
N. gonorrhoeae RNA, TMA: NOT DETECTED
T. vaginalis RNA, QL TMA: NOT DETECTED

## 2015-10-24 LAB — PAP, TP IMAGING W/ HPV RNA, RFLX HPV TYPE 16,18/45: HPV mRNA, High Risk: NOT DETECTED

## 2016-10-23 ENCOUNTER — Ambulatory Visit: Payer: Self-pay | Admitting: Certified Nurse Midwife

## 2016-11-25 ENCOUNTER — Emergency Department (HOSPITAL_COMMUNITY): Payer: Medicaid Other

## 2016-11-25 ENCOUNTER — Emergency Department (HOSPITAL_COMMUNITY)
Admission: EM | Admit: 2016-11-25 | Discharge: 2016-11-25 | Disposition: A | Discharge status: Home / Self Care | Payer: Medicaid Other | Attending: Emergency Medicine | Admitting: Emergency Medicine

## 2016-11-25 ENCOUNTER — Encounter (HOSPITAL_COMMUNITY): Payer: Self-pay

## 2016-11-25 DIAGNOSIS — M940 Chondrocostal junction syndrome [Tietze]: Secondary | ICD-10-CM | POA: Insufficient documentation

## 2016-11-25 DIAGNOSIS — Z79899 Other long term (current) drug therapy: Secondary | ICD-10-CM | POA: Insufficient documentation

## 2016-11-25 DIAGNOSIS — R0781 Pleurodynia: Secondary | ICD-10-CM | POA: Diagnosis present

## 2016-11-25 MED ORDER — METHOCARBAMOL 500 MG PO TABS: 500.0000 mg | ORAL_TABLET | Freq: Every evening | ORAL | 0 refills | Status: DC | PRN

## 2016-11-25 MED ORDER — TRAMADOL HCL 50 MG PO TABS: 50.0000 mg | ORAL_TABLET | Freq: Four times a day (QID) | ORAL | 0 refills | Status: DC | PRN

## 2016-11-25 NOTE — ED Provider Notes (Signed)
[facility]-EMERGENCY DEPT Provider Note   CSN: [id number] Arrival date & time: 11/25/16  1004     History   Chief Complaint Chief Complaint  Patient presents with  . Flank Pain    HPI Jocelyn Townsend is a 36 y.o. female who presents with right posterior rib pain. No significant PMH. She states that last night she started having pain in that area. She has had this before when she was sick and it went away on its own in 4-5 days however this time the pain is more severe. It is constant, dull achy pain that becomes sharp when she takes a deep breath. The pain is spreading to the left posterior ribs as well. She had a difficult time sleeping last night due to pain and was unable to get comfortable. Denies fever, chills, but has had nasal congestion/URI symptoms for the past several days. She has been taking Ibuprofen 800mg  three times daily and Pseudofed which has helped with nasal congestion. Endorses mild cough as well. Reports numerous sick contacts at home.  HPI  History reviewed. No pertinent past medical history.  Patient Active Problem List   Diagnosis Date Noted  . Well woman exam 10/20/2015  . 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 No pertinent surgical history.  OB History    Gravida Para Term Preterm AB Living   4 2 2   2 2    SAB TAB Ectopic Multiple Live Births   1 1     2        Home Medications    Prior to Admission medications   Medication Sig Start Date End Date Taking? Authorizing Provider  cholecalciferol (VITAMIN D) 1000 UNITS tablet Take 1,000 Units by mouth daily.    Historical Provider, MD  glucosamine-chondroitin 500-400 MG tablet Take 1 tablet by mouth 3 (three) times daily.    Historical Provider, MD  ibuprofen (ADVIL,MOTRIN) 800 MG tablet Take 800 mg by mouth every 8 (eight) hours as needed.    Historical Provider, MD    Family History History reviewed. No pertinent family history.  Social History Social History  Substance Use  Topics  . Smoking status: Never Smoker  . Smokeless tobacco: Never Used  . Alcohol use No     Allergies   Nitrofurantoin monohyd macro and Nitrofurantoin monohyd macro   Review of Systems Review of Systems  Constitutional: Negative for chills and fever.  HENT: Positive for congestion and rhinorrhea. Negative for ear pain and sore throat.   Respiratory: Positive for cough. Negative for shortness of breath.   Cardiovascular: Positive for chest pain (rib pain).  Genitourinary: Negative for dysuria.  All other systems reviewed and are negative.    Physical Exam Updated Vital Signs BP 136/77 (BP Location: Left Arm)   Pulse 76   Temp 98.4 F (36.9 C) (Oral)   Resp 18   LMP 10/22/2016   SpO2 99%   Physical Exam  Constitutional: She is oriented to person, place, and time. She appears well-developed and well-nourished. No distress.  HENT:  Head: Normocephalic and atraumatic.  Right Ear: Hearing, tympanic membrane, external ear and ear canal normal.  Left Ear: Hearing, tympanic membrane, external ear and ear canal normal.  Nose: Nose normal.  Mouth/Throat: Uvula is midline, oropharynx is clear and moist and mucous membranes are normal. Tonsillar exudate (small exudate on left side).  Eyes: Conjunctivae are normal. Pupils are equal, round, and reactive to light. Right eye exhibits no discharge. Left eye exhibits no  discharge. No scleral icterus.  Neck: Normal range of motion.  Cardiovascular: Normal rate and regular rhythm.  Exam reveals no gallop and no friction rub.   No murmur heard. Pulmonary/Chest: Breath sounds normal. No respiratory distress. She has no wheezes. She has no rales. She exhibits tenderness (Reproducible tenderness over right lower ribs).  Decreased effort due to pain  Abdominal: She exhibits no distension. There is no tenderness (No CVA tenderness).  Neurological: She is alert and oriented to person, place, and time.  Skin: Skin is warm and dry.    Psychiatric: She has a normal mood and affect. Her behavior is normal.  Nursing note and vitals reviewed.    ED Treatments / Results  Labs (all labs ordered are listed, but only abnormal results are displayed) Labs Reviewed - No data to display  EKG  EKG Interpretation None       Radiology Dg Chest 2 View  Result Date: 11/25/2016 CLINICAL DATA:  Cough for several days EXAM: CHEST  2 VIEW COMPARISON:  None. FINDINGS: The heart size and mediastinal contours are within normal limits. Both lungs are clear. The visualized skeletal structures are unremarkable. IMPRESSION: No active cardiopulmonary disease. Electronically Signed   By: Alcide CleverMark  Lukens M.D.   On: 11/25/2016 10:57    Procedures Procedures (including critical care time)  Medications Ordered in ED Medications - No data to display   Initial Impression / Assessment and Plan / ED Course  I have reviewed the triage vital signs and the nursing notes.  Pertinent labs & imaging results that were available during my care of the patient were reviewed by me and considered in my medical decision making (see chart for details).  Clinical Course    36 year old female with symptoms consistent with costochondritis. Patient is afebrile, not tachycardic or tachypneic, normotensive, and not hypoxic. CXR negative. She is PERC negative. Do not feel labs would be helpful at this time. Encouraged continuing NSAIDs and will rx muscle relaxer and Tramadol. Patient is NAD, non-toxic, with stable VS. Patient is informed of clinical course, understands medical decision making process, and agrees with plan. Opportunity for questions provided and all questions answered. Return precautions given.   Final Clinical Impressions(s) / ED Diagnoses   Final diagnoses:  Costochondritis    New Prescriptions Discharge Medication List as of 11/25/2016 12:32 PM    START taking these medications   Details  methocarbamol (ROBAXIN) 500 MG tablet Take 1 tablet  (500 mg total) by mouth at bedtime and may repeat dose one time if needed., Starting Sun 11/25/2016, Print    traMADol (ULTRAM) 50 MG tablet Take 1 tablet (50 mg total) by mouth every 6 (six) hours as needed., Starting Sun 11/25/2016, Print         Bethel BornKelly Marie Vanita Cannell, PA-C 11/28/16 1101    Vanetta MuldersScott Zackowski, MD 12/02/16 0825

## 2016-11-25 NOTE — ED Triage Notes (Signed)
Pt started having cough/congestion yesterday. Pt states rib cage towards back hurts with breath.  No fever.

## 2016-11-25 NOTE — Discharge Instructions (Signed)
Continue Ibuprofen. Take this medicine with food. Take muscle relaxer at bedtime to help you sleep. This medicine makes you drowsy so do not take before driving or work Use Tramadol when pain is severe Use heat or ice to help with pain (which ever feels better) Follow up with your family doctor

## 2017-04-29 ENCOUNTER — Ambulatory Visit: Payer: Medicaid Other | Admitting: Certified Nurse Midwife

## 2017-05-16 ENCOUNTER — Other Ambulatory Visit (HOSPITAL_COMMUNITY)
Admission: RE | Admit: 2017-05-16 | Discharge: 2017-05-16 | Disposition: A | Discharge status: Home / Self Care | Payer: Medicaid Other | Source: Ambulatory Visit | Attending: Certified Nurse Midwife | Admitting: Certified Nurse Midwife

## 2017-05-16 ENCOUNTER — Ambulatory Visit (INDEPENDENT_AMBULATORY_CARE_PROVIDER_SITE_OTHER): Payer: Medicaid Other | Admitting: Certified Nurse Midwife

## 2017-05-16 ENCOUNTER — Encounter: Payer: Self-pay | Admitting: Certified Nurse Midwife

## 2017-05-16 VITALS — BP 125/78 | HR 63 | Ht 65.0 in | Wt 168.0 lb

## 2017-05-16 DIAGNOSIS — Z01419 Encounter for gynecological examination (general) (routine) without abnormal findings: Secondary | ICD-10-CM

## 2017-05-16 DIAGNOSIS — N898 Other specified noninflammatory disorders of vagina: Secondary | ICD-10-CM

## 2017-05-16 DIAGNOSIS — Z0000 Encounter for general adult medical examination without abnormal findings: Secondary | ICD-10-CM | POA: Diagnosis not present

## 2017-05-16 NOTE — Progress Notes (Signed)
Subjective:        Jocelyn Townsend is a 36 y.o. female here for a routine exam.  Current complaints: none, just finished with a period.  Regular monthly periods, lasting 5 days, denies cramping or menorrhagia.  Does have cramping with her cycle, for the first 3 days.  States that motrin is helping with occasional blood clots during her cycle. Has not taken anything for the cramping.    Currently sexually active with spouse.  Declines STD testing.      Personal health questionnaire:  Is patient Ashkenazi Jewish, have a family history of breast and/or ovarian cancer: yes, MGM had BCA dx when she was older Is there a family history of uterine cancer diagnosed at age < 68, gastrointestinal cancer, urinary tract cancer, family member who is a Personnel officer syndrome-associated carrier: no Is the patient overweight and hypertensive, family history of diabetes, personal history of gestational diabetes, preeclampsia or PCOS: yes Is patient over 11, have PCOS,  family history of premature CHD under age 33, diabetes, smoke, have hypertension or peripheral artery disease:  no At any time, has a partner hit, kicked or otherwise hurt or frightened you?: no Over the past 2 weeks, have you felt down, depressed or hopeless?: no Over the past 2 weeks, have you felt little interest or pleasure in doing things?:no   Gynecologic History No LMP recorded. Patient is not currently having periods (Reason: Irregular Periods). Contraception: tubal ligation Last Pap: 10/20/15. Results were: normal, had leep procedure in 2003.   Last mammogram: N/A.   Obstetric History OB History  Gravida Para Term Preterm AB Living  4 2 2   2 2   SAB TAB Ectopic Multiple Live Births  1 1     2     # Outcome Date GA Lbr Len/2nd Weight Sex Delivery Anes PTL Lv  4 Term 01/05/08 [redacted]w[redacted]d  7 lb 9 oz (3.43 kg) F Vag-Spont EPI  LIV  3 SAB 05/08/07          2 Term 04/08/02 [redacted]w[redacted]d  9 lb 5 oz (4.224 kg) F Vag-Spont EPI  LIV  1 TAB 05/17/95               History reviewed. No pertinent past medical history.  History reviewed. No pertinent surgical history.   Current Outpatient Prescriptions:  .  cholecalciferol (VITAMIN D) 1000 UNITS tablet, Take 1,000 Units by mouth daily., Disp: , Rfl:  .  glucosamine-chondroitin 500-400 MG tablet, Take 1 tablet by mouth 3 (three) times daily., Disp: , Rfl:  .  ibuprofen (ADVIL,MOTRIN) 800 MG tablet, Take 800 mg by mouth every 8 (eight) hours as needed., Disp: , Rfl:  Allergies  Allergen Reactions  . Nitrofurantoin Monohyd Macro   . Nitrofurantoin Monohyd Macro     Social History  Substance Use Topics  . Smoking status: Never Smoker  . Smokeless tobacco: Never Used  . Alcohol use 0.0 oz/week     Comment: occ    Family History  Problem Relation Age of Onset  . Breast cancer Maternal Grandmother   . Breast cancer Maternal Grandfather       Review of Systems  Constitutional: negative for fatigue and weight loss Respiratory: negative for cough and wheezing Cardiovascular: negative for chest pain, fatigue and palpitations Gastrointestinal: negative for abdominal pain and change in bowel habits Musculoskeletal:negative for myalgias Neurological: negative for gait problems and tremors Behavioral/Psych: negative for abusive relationship, depression Endocrine: negative for temperature intolerance    Genitourinary:negative for abnormal  menstrual periods, genital lesions, hot flashes, sexual problems and vaginal discharge Integument/breast: negative for breast lump, breast tenderness, nipple discharge and skin lesion(s)    Objective:       BP 125/78   Pulse 63   Ht 5\' 5"  (1.651 m)   Wt 168 lb (76.2 kg)   LMP 05/09/2017   BMI 27.96 kg/m  General:   alert  Skin:   no rash or abnormalities  Lungs:   clear to auscultation bilaterally  Heart:   regular rate and rhythm, S1, S2 normal, no murmur, click, rub or gallop  Breasts:   normal without suspicious masses, skin or nipple changes  or axillary nodes  Abdomen:  normal findings: no organomegaly, soft, non-tender and no hernia  Pelvis:  External genitalia: normal general appearance Urinary system: urethral meatus normal and bladder without fullness, nontender Vaginal: normal without tenderness, induration or masses Cervix: normal appearance Adnexa: normal bimanual exam Uterus: anteverted and non-tender, normal size   Lab Review Urine pregnancy test Labs reviewed yes Radiologic studies reviewed no  50% of 30 min visit spent on counseling and coordination of care.    Assessment:    Healthy female exam.   Vaginal discharge  Plan:    Education reviewed: calcium supplements, depression evaluation, low fat, low cholesterol diet, safe sex/STD prevention, self breast exams, skin cancer screening and weight bearing exercise. Contraception: tubal ligation. Follow up in: 1 year.

## 2017-05-17 LAB — CERVICOVAGINAL ANCILLARY ONLY
Bacterial vaginitis: NEGATIVE
Candida vaginitis: POSITIVE — AB

## 2017-05-20 LAB — CYTOLOGY - PAP
Diagnosis: NEGATIVE
HPV: NOT DETECTED

## 2017-05-21 ENCOUNTER — Other Ambulatory Visit: Payer: Self-pay | Admitting: Certified Nurse Midwife

## 2017-05-21 DIAGNOSIS — B373 Candidiasis of vulva and vagina: Secondary | ICD-10-CM

## 2017-05-21 DIAGNOSIS — B3731 Acute candidiasis of vulva and vagina: Secondary | ICD-10-CM

## 2017-05-21 MED ORDER — TERCONAZOLE 0.8 % VA CREA: 1.0000 | TOPICAL_CREAM | Freq: Every day | VAGINAL | 0 refills | Status: DC

## 2017-05-21 MED ORDER — FLUCONAZOLE 200 MG PO TABS: 200.0000 mg | ORAL_TABLET | Freq: Once | ORAL | 0 refills | Status: AC

## 2018-09-13 ENCOUNTER — Other Ambulatory Visit: Payer: Self-pay | Admitting: Otolaryngology

## 2018-09-13 DIAGNOSIS — J3489 Other specified disorders of nose and nasal sinuses: Secondary | ICD-10-CM

## 2018-09-16 ENCOUNTER — Ambulatory Visit (INDEPENDENT_AMBULATORY_CARE_PROVIDER_SITE_OTHER): Payer: Medicaid Other | Admitting: Obstetrics and Gynecology

## 2018-09-16 ENCOUNTER — Encounter: Payer: Self-pay | Admitting: Obstetrics and Gynecology

## 2018-09-16 ENCOUNTER — Ambulatory Visit: Payer: Medicaid Other | Attending: Internal Medicine | Admitting: Occupational Therapy

## 2018-09-16 ENCOUNTER — Other Ambulatory Visit (HOSPITAL_COMMUNITY)
Admission: RE | Admit: 2018-09-16 | Discharge: 2018-09-16 | Disposition: A | Discharge status: Home / Self Care | Payer: Medicaid Other | Source: Ambulatory Visit | Attending: Obstetrics and Gynecology | Admitting: Obstetrics and Gynecology

## 2018-09-16 ENCOUNTER — Other Ambulatory Visit: Payer: Self-pay

## 2018-09-16 ENCOUNTER — Encounter: Payer: Self-pay | Admitting: Occupational Therapy

## 2018-09-16 VITALS — BP 123/84 | HR 74 | Ht 65.0 in | Wt 166.9 lb

## 2018-09-16 DIAGNOSIS — M25521 Pain in right elbow: Secondary | ICD-10-CM | POA: Diagnosis present

## 2018-09-16 DIAGNOSIS — Z01419 Encounter for gynecological examination (general) (routine) without abnormal findings: Secondary | ICD-10-CM

## 2018-09-16 DIAGNOSIS — M79641 Pain in right hand: Secondary | ICD-10-CM

## 2018-09-16 DIAGNOSIS — R208 Other disturbances of skin sensation: Secondary | ICD-10-CM

## 2018-09-16 DIAGNOSIS — Z0000 Encounter for general adult medical examination without abnormal findings: Secondary | ICD-10-CM

## 2018-09-16 DIAGNOSIS — M79642 Pain in left hand: Secondary | ICD-10-CM | POA: Diagnosis present

## 2018-09-16 DIAGNOSIS — Z1151 Encounter for screening for human papillomavirus (HPV): Secondary | ICD-10-CM | POA: Diagnosis not present

## 2018-09-16 DIAGNOSIS — M6281 Muscle weakness (generalized): Secondary | ICD-10-CM | POA: Diagnosis present

## 2018-09-16 NOTE — Progress Notes (Signed)
Pt presents annual and all STD testing.  Pt c/o painful ingrown hair R side of vulva.  Pt also found pea size lump L breast tender with palpation.

## 2018-09-16 NOTE — Therapy (Signed)
[facility] [address] [place], [place], [address] Phone: [phone]   Fax:  [phone]  Occupational Therapy Evaluation  Patient Details  Name: Maleiah Dula MRN: [id number] Date of Birth: 1981-03-21 Referring Provider (OT): Dr. Willey Blade   Encounter Date: 09/16/2018  OT End of Session - 09/16/18 2159    Visit Number  1    Number of Visits  10    Date for OT Re-Evaluation  11/15/18    Authorization Type  Medicaid--awaiting authorization    OT Start Time  1323    OT Stop Time  1400    OT Time Calculation (min)  37 min    Activity Tolerance  Patient tolerated treatment well    Behavior During Therapy  [facility] for tasks assessed/performed       History reviewed. No pertinent past medical history.  Past Surgical History:  Procedure Laterality Date  . TUBAL LIGATION  2009    There were no vitals filed for this visit.  Subjective Assessment - 09/16/18 1333    Subjective   Pt reports RUE worse than LUE    Pertinent History  PMH:  hx of R shoulder pain/stiffness (MD gave pt ex approx 1 year ago)    Patient Stated Goals  strengthen hands, more mobility in hands without dropping     Currently in Pain?  No/denies    Pain Score  --   typically 5-6/10 reported (none currently)   Pain Location  Hand   also R elbow at times   Pain Orientation  Left;Right    Pain Descriptors / Indicators  Tingling;Sharp;Numbness    Pain Type  Chronic pain   has continually gotten worse   Pain Onset  More than a month ago    Pain Frequency  Intermittent    Aggravating Factors   sleeping at night, pressure on elbow/bent elbow, gripping with R hand    Pain Relieving Factors  rest, splints?        [facility] OT Assessment - 09/16/18 0001      Assessment   Medical Diagnosis  bilateral carpal tunnel syndrome    Referring Provider (OT)  Dr. Willey Blade    Onset Date/Surgical Date  --   MD appt approx 1 month ago per pt   Hand Dominance   Right    Prior Therapy  none, given ex for R shoulder by MD approx 1 year ago      Precautions   Precautions  None      Balance Screen   Has the patient fallen in the past 6 months  No      Home  Environment   Family/patient expects to be discharged to:  Private residence    Lives With  --   lives with 16 and 37 y.o. dtrs     Prior Function   Level of Independence  Independent    Vocation  Part time employment    Copywriter, advertising houses/offices      ADL   ADL comments  difficulty brushing/styling daughter's hair, dropping light objects, occasional difficulty with buttons, difficulty with lifting heavier pots/pans, difficulty opening jars, difficulty/incr pain with wiping (may need to switch hands), writing or typing a long time   mod I with BADLs/IADLs     Mobility   Mobility Status Comments  independent      Written Expression   Dominant Hand  Right      Sensation   Light Touch  Impaired Detail    Additional Comments  Pt reports pain/numbess/tingling in all fingers (primarily 4-5th digit per pt report)   R>L     Coordination   9 Hole Peg Test  Right;Left    Right 9 Hole Peg Test  17.87    Left 9 Hole Peg Test  20.19      ROM / Strength   AROM / PROM / Strength  AROM      AROM   Overall AROM   Within functional limits for tasks performed   mild decr R shoulder ROM when compared with LUE     Hand Function   Right Hand Grip (lbs)  51    Right Hand Lateral Pinch  12 lbs    Right Hand 3 Point Pinch  9 lbs    Left Hand Grip (lbs)  53    Left Hand Lateral Pinch  17 lbs    Left 3 point pinch  16 lbs                      OT Education - 09/16/18 2156    Education Details  Reviewed basic CTS handout including positions/basic activities to avoid, HEP (#1 wrist flex/ext with elbow bent, finger abduction, tendon glides from CTS handout)); avoid repetitive/sustained R elbow flex and pressure on R elbow; Recommended wearing bilateral wrist splints  at night and during the day as able    Person(s) Educated  Patient    Methods  Explanation;Demonstration;Verbal cues;Handout    Comprehension  Verbalized understanding;Returned demonstration;Verbal cues required;Need further instruction       OT Short Term Goals - 09/16/18 2221      OT SHORT TERM GOAL #1   Title  Pt will be independent with inital HEP to decr pain/parasthesias and prevent stiffness.--check STGs 10/16/18    Baseline  dependent    Status  New      OT SHORT TERM GOAL #2   Title  Pt will be independent with updated splint wear/care schedule.    Baseline  dependent, only currently wearing during the day    Period  Weeks      OT SHORT TERM GOAL #3   Title  Pt will verbalize understanding of activity modifications/positions to avoid to decr pain/parasthesias.    Baseline  dependent    Status  New        OT Long Term Goals - 09/16/18 2224      OT LONG TERM GOAL #1   Title  Pt will be independent with updated HEP.--check LTGs 11/15/18    Baseline  dependent      OT LONG TERM GOAL #2   Title  Pt will report bilateral hand pain less than or equal to 2/10 with ADLs/IADLs and work tasks.    Baseline  5-6/10    Status  New      OT LONG TERM GOAL #3   Title  Pt will demo at least 15lbs dominant R lateral pinch strength for ADL/IADL tasks.    Baseline  R-12lbs (with pain), L-17lbs    Status  New      OT LONG TERM GOAL #4   Title  Pt will demo at least 14lbs dominant R 3point pinch strength to assist with picking up objects and decr drops.    Baseline  R-9lbs (with pain), L-16lbs            Plan - 09/16/18 2200    Clinical Impression Statement  Pt is  a 37 y.o. female referred to occupational therapy for bilateral carpal tunnel syndrome.  Pt with PMH that includes hx of R shoulder stiffness/pain with possible cervical degeneration.  Pt presents today with pain and parasthesias in bilateral hands (all digits) and pain at R lateral elbow, decr strength, and decr UE  functional use for ADLs/IADLs.  Pt would benefit from occupational therapy to address these deficits for improved quality of life, decr pain, improved ADL/IADL performance, improved UE functional use, and to decr risk of future complications.    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    Occupational performance deficits (Please refer to evaluation for details):  ADL's;IADL's;Rest and Sleep;Work;Leisure;Social Participation    Rehab Potential  Good    OT Frequency  1x / week    OT Duration  --   eval +1x/wk for 3 wks (initally due to Medicaid restrictions) followed by 1x week for 6 weeks   OT Treatment/Interventions  Self-care/ADL training;Cryotherapy;Paraffin;Therapeutic exercise;DME and/or AE instruction;Splinting;Manual Therapy;Neuromuscular education;Fluidtherapy;Ultrasound;Electrical Stimulation;Moist Heat;Iontophoresis;Contrast Bath;Energy conservation;Passive range of motion;Therapeutic activities;Patient/family education    Plan  review and progress CTS HEP as able, continue with activity modifications, fludio, ?ulnar nerve gliding for RUE    Clinical Decision Making  Limited treatment options, no task modification necessary    OT Home Exercise Plan  education provided:  basic CTS education/initial HEP/basic positions and activities to avoid, avoid repetitive/prolonged elbow flex and pressure on R elbow    Consulted and Agree with Plan of Care  Patient       Patient will benefit from skilled therapeutic intervention in order to improve the following deficits and impairments:  Impaired sensation, Pain, Improper body mechanics, Decreased knowledge of use of DME, Decreased strength, Impaired UE functional use, Decreased activity tolerance  Visit Diagnosis: Pain in right  hand  Pain in left hand  Muscle weakness (generalized)  Other disturbances of skin sensation  Pain in right elbow    Problem List Patient Active Problem List   Diagnosis Date Noted  . Well woman exam 10/20/2015  . 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 [place], [place], [address] Phone: [phone]   Fax:  [phone]  Name: Jeilyn Reznik MRN: [id number] Date of Birth: 14-Apr-1981   Willa Frater, OTR/L [facility] [address] [place], [place]  [address] [phone] phone [phone] 09/16/18 10:29 PM

## 2018-09-16 NOTE — Progress Notes (Signed)
Subjective:     Jocelyn Townsend is a 37 y.o. female G4P2 with BMI 26 and LMP 10/16 who is here for a comprehensive physical exam. The patient reports no problems. She is sexually active using BTL for contraception. She reports a monthly 4-5 day period. She denies any pelvic pain or abnormal discharge. Patient reports feeling a mass in her left breast but is not sure what she was feeling as she felt it on her other breast as well. She reports the presence of an ingrown hair which she attempted to drain yesterday  History reviewed. No pertinent past medical history. History reviewed. No pertinent surgical history. Family History  Problem Relation Age of Onset  . Breast cancer Maternal Grandmother   . Breast cancer Maternal Grandfather     Social History   Socioeconomic History  . Marital status: Single    Spouse name: Not on file  . Number of children: Not on file  . Years of education: Not on file  . Highest education level: Not on file  Occupational History  . Not on file  Social Needs  . Financial resource strain: Not on file  . Food insecurity:    Worry: Not on file    Inability: Not on file  . Transportation needs:    Medical: Not on file    Non-medical: Not on file  Tobacco Use  . Smoking status: Never Smoker  . Smokeless tobacco: Never Used  Substance and Sexual Activity  . Alcohol use: Yes    Alcohol/week: 0.0 standard drinks    Comment: occ  . Drug use: No  . Sexual activity: Yes    Partners: Male    Birth control/protection: Surgical  Lifestyle  . Physical activity:    Days per week: Not on file    Minutes per session: Not on file  . Stress: Not on file  Relationships  . Social connections:    Talks on phone: Not on file    Gets together: Not on file    Attends religious service: Not on file    Active member of club or organization: Not on file    Attends meetings of clubs or organizations: Not on file    Relationship status: Not on file  . Intimate partner  violence:    Fear of current or ex partner: Not on file    Emotionally abused: Not on file    Physically abused: Not on file    Forced sexual activity: Not on file  Other Topics Concern  . Not on file  Social History Narrative   ** Merged History Encounter **       Health Maintenance  Topic Date Due  . Samul Dada  12/17/1999  . INFLUENZA VACCINE  06/19/2018  . PAP SMEAR  05/16/2020  . HIV Screening  Completed       Review of Systems Pertinent items are noted in HPI.   Objective:  Blood pressure 123/84, pulse 74, height '5\' 5"'  (1.651 m), weight 166 lb 14.4 oz (75.7 kg), last menstrual period 09/03/2018.      GENERAL: Well-developed, well-nourished female in no acute distress.  HEENT: Normocephalic, atraumatic. Sclerae anicteric.  NECK: Supple. Normal thyroid.  LUNGS: Clear to auscultation bilaterally.  HEART: Regular rate and rhythm. BREASTS: Symmetric in size. No palpable masses or lymphadenopathy, skin changes, or nipple drainage. ABDOMEN: Soft, nontender, nondistended. No organomegaly. PELVIC: Normal external female genitalia with healing ingrain hair follicle on mons pubis. Vagina is pink and rugated.  Normal discharge.  Normal appearing cervix. Uterus is normal in size. No adnexal mass or tenderness. EXTREMITIES: No cyanosis, clubbing, or edema, 2+ distal pulses.    Assessment:    Healthy female exam.      Plan:    Pap smear collected BRCA testing ordered Patient will be contacted with abnormal results Patient advised to avoid shaving with dull blades as to avoid ingrown hair follicles See After Visit Summary for Counseling Recommendations

## 2018-09-17 LAB — CERVICOVAGINAL ANCILLARY ONLY
Chlamydia: NEGATIVE
Neisseria Gonorrhea: NEGATIVE
Trichomonas: NEGATIVE

## 2018-09-18 ENCOUNTER — Ambulatory Visit
Admission: RE | Admit: 2018-09-18 | Discharge: 2018-09-18 | Disposition: A | Discharge status: Home / Self Care | Payer: Medicaid Other | Source: Ambulatory Visit | Attending: Otolaryngology | Admitting: Otolaryngology

## 2018-09-18 DIAGNOSIS — J3489 Other specified disorders of nose and nasal sinuses: Secondary | ICD-10-CM

## 2018-09-18 LAB — CYTOLOGY - PAP
Adequacy: ABSENT
Diagnosis: NEGATIVE
HPV: NOT DETECTED

## 2018-09-18 MED ORDER — IOPAMIDOL (ISOVUE-300) INJECTION 61%
75.0000 mL | Freq: Once | INTRAVENOUS | Status: AC | PRN
  Administered 2018-09-18: 75 mL via INTRAVENOUS

## 2018-09-30 ENCOUNTER — Encounter: Payer: Self-pay | Admitting: Occupational Therapy

## 2018-09-30 ENCOUNTER — Other Ambulatory Visit (HOSPITAL_COMMUNITY): Payer: Self-pay | Admitting: Family Medicine

## 2018-09-30 ENCOUNTER — Ambulatory Visit (HOSPITAL_COMMUNITY)
Admission: RE | Admit: 2018-09-30 | Discharge: 2018-09-30 | Disposition: A | Discharge status: Home / Self Care | Payer: Medicaid Other | Source: Ambulatory Visit | Attending: Family Medicine | Admitting: Family Medicine

## 2018-09-30 ENCOUNTER — Ambulatory Visit: Payer: Medicaid Other | Attending: Internal Medicine | Admitting: Occupational Therapy

## 2018-09-30 DIAGNOSIS — M79642 Pain in left hand: Secondary | ICD-10-CM | POA: Diagnosis present

## 2018-09-30 DIAGNOSIS — M79641 Pain in right hand: Secondary | ICD-10-CM | POA: Insufficient documentation

## 2018-09-30 DIAGNOSIS — R208 Other disturbances of skin sensation: Secondary | ICD-10-CM | POA: Diagnosis present

## 2018-09-30 DIAGNOSIS — M79672 Pain in left foot: Secondary | ICD-10-CM

## 2018-09-30 DIAGNOSIS — M25521 Pain in right elbow: Secondary | ICD-10-CM | POA: Insufficient documentation

## 2018-09-30 DIAGNOSIS — M6281 Muscle weakness (generalized): Secondary | ICD-10-CM | POA: Diagnosis present

## 2018-09-30 DIAGNOSIS — M7732 Calcaneal spur, left foot: Secondary | ICD-10-CM | POA: Diagnosis not present

## 2018-09-30 DIAGNOSIS — M79673 Pain in unspecified foot: Secondary | ICD-10-CM | POA: Diagnosis present

## 2018-09-30 LAB — COMPREHENSIVE BRCA1/2 ANALYSIS

## 2018-09-30 LAB — RPR: RPR Ser Ql: NONREACTIVE

## 2018-09-30 LAB — HIV ANTIBODY (ROUTINE TESTING W REFLEX): HIV Screen 4th Generation wRfx: NONREACTIVE

## 2018-09-30 LAB — HEPATITIS C ANTIBODY: Hep C Virus Ab: <0.1 s/co ratio (ref 0.0–0.9)

## 2018-09-30 LAB — BRCASSURE COMPREHENSIVE TEST

## 2018-09-30 LAB — HEPATITIS B SURFACE ANTIGEN: Hepatitis B Surface Ag: NEGATIVE

## 2018-09-30 NOTE — Therapy (Signed)
[facility] [address] [place], [place], [address] Phone: [phone]   Fax:  [phone]  Occupational Therapy Evaluation  Patient Details  Name: Jocelyn Townsend MRN: [id number] Date of Birth: November 18, 1981 Referring Provider (OT): Dr. Willey Blade   Encounter Date: 09/30/2018  OT End of Session - 09/30/18 1037    Visit Number  2    Number of Visits  10    Date for OT Re-Evaluation  11/15/18    Authorization Type  Medicaid--awaiting authorization    Authorization - Visit Number  1    Authorization - Number of Visits  3    OT Start Time  0932    OT Stop Time  1015    OT Time Calculation (min)  43 min    Activity Tolerance  Patient tolerated treatment well    Behavior During Therapy  [facility] for tasks assessed/performed       History reviewed. No pertinent past medical history.  Past Surgical History:  Procedure Laterality Date  . TUBAL LIGATION  2009    There were no vitals filed for this visit.  Subjective Assessment - 09/30/18 0933    Subjective   worse at night - reports 10/10 pain/numbness in right hand     Pertinent History  PMH:  hx of R shoulder pain/stiffness (MD gave pt ex approx 1 year ago)    Patient Stated Goals  strengthen hands, more mobility in hands without dropping     Currently in Pain?  No/denies    Pain Score  0-No pain                  OT Treatments/Exercises (OP) - 09/30/18 0001      ADLs   Grooming  Patient said she can now brush daughter's hair, but has difficulty securing it in ponytail- discussed less resistive bands for hair care.      Functional Mobility  Discussed means to keep right elbow straight at night, as sustained elbow flexion leads to painful numbness in right hand.    Cooking  Discussed benefits of using two hands when lifting or pouring off pots while cooking.      Work  Discussed options for hand position and/or cleaning tools that would help keep more neutral  wrist for cleaning tub, discussed benefits of alternating tasks - e.g. sweeping and vacuuming.        Neurological Re-education Exercises   Other Exercises 1  Reviewed conservatve management CTS handout.  Patient had family crisis involving daughter, and was unable to complete exercise program.  Reinforced the importance of completing exercises to point of stretch (not pain) sustaining stretch, and completing each exercise slowly.  Patient needed verbal and demonstration cueing for shoulder position right to reduce shoulder elevation with exercise.      Other Exercises 2  Patient with report of lateral elbow pain - point tender - will monitor.        Manual Therapy   Manual therapy comments  AROM in right wrist 60*, left wrist 70*. Gentle traction to mobilize carpal bones followed with active motion to 63* right wrist            OT Education - 09/30/18 1028    Education Details  Reviewed CTS conservative management handout with patient.  Discussed options for work / ADL modification, splint schedule with goal to reduce splint usage, and HEP.      Person(s) Educated  Patient    Methods  Explanation;Demonstration;Verbal  cues;Handout;Tactile cues    Comprehension  Verbalized understanding;Returned demonstration;Verbal cues required;Need further instruction       OT Short Term Goals - 09/30/18 1034      OT SHORT TERM GOAL #1   Title  Pt will be independent with inital HEP to decr pain/parasthesias and prevent stiffness.--check STGs 10/16/18    Baseline  dependent    Status  On-going      OT SHORT TERM GOAL #2   Title  Pt will be independent with updated splint wear/care schedule.    Baseline  dependent, only currently wearing during the day    Status  Achieved      OT SHORT TERM GOAL #3   Title  Pt will verbalize understanding of activity modifications/positions to avoid to decr pain/parasthesias.    Baseline  dependent    Status  On-going        OT Long Term Goals - 09/30/18  1034      OT LONG TERM GOAL #1   Title  Pt will be independent with updated HEP.--check LTGs 11/15/18    Baseline  dependent    Status  On-going      OT LONG TERM GOAL #2   Title  Pt will report bilateral hand pain less than or equal to 2/10 with ADLs/IADLs and work tasks.    Baseline  5-6/10    Status  On-going      OT LONG TERM GOAL #3   Title  Pt will demo at least 15lbs dominant R lateral pinch strength for ADL/IADL tasks.    Baseline  R-12lbs (with pain), L-17lbs    Status  On-going      OT LONG TERM GOAL #4   Title  Pt will demo at least 14lbs dominant R 3point pinch strength to assist with picking up objects and decr drops.    Baseline  R-9lbs (with pain), L-16lbs    Status  On-going            Plan - 09/30/18 1030    Clinical Impression Statement  Patient reports that she had a difficult family situation which kept her from completing HEP regularly.  Situation is resolving, patient appears very motivated for improved functional use especially of her dominant right hand.      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    Occupational performance deficits (Please refer to evaluation for details):  ADL's;IADL's;Rest and Sleep;Work;Leisure;Social Participation    Rehab Potential  Good    OT Treatment/Interventions  Self-care/ADL training;Cryotherapy;Paraffin;Therapeutic exercise;DME and/or AE instruction;Splinting;Manual Therapy;Neuromuscular education;Fluidtherapy;Ultrasound;Electrical Stimulation;Moist Heat;Iontophoresis;Contrast Bath;Energy conservation;Passive range of motion;Therapeutic activities;Patient/family education    Plan  Continue to progress HEP, modify work and ADL/IADL tasks, address sleep positioning, consider exercise program (we discussed  swimming)     Clinical Decision Making  Limited treatment options, no task modification necessary    OT Home Exercise Plan  education provided:  basic CTS education/initial HEP/basic positions and activities to avoid, avoid repetitive/prolonged elbow flex and pressure on R elbow    Consulted and Agree with Plan of Care  Patient       Patient will benefit from skilled therapeutic intervention in order to improve the following deficits and impairments:  Impaired sensation, Pain, Improper body mechanics, Decreased knowledge of use of DME, Decreased strength, Impaired UE functional use, Decreased activity tolerance  Visit Diagnosis: Pain in right hand  Other disturbances of skin sensation  Muscle weakness (generalized)  Pain in left hand    Problem List Patient Active Problem List   Diagnosis Date Noted  . Well woman exam 10/20/2015  . 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

## 2018-09-30 NOTE — Therapy (Deleted)
[facility] [address] [place], [place], [address] Phone: [phone]   Fax:  [phone]  Occupational Therapy Treatment  Patient Details  Name: Jocelyn Townsend MRN: [id number] Date of Birth: 1981/05/21 Referring Provider (OT): Dr. Willey BladeEric Dean   Encounter Date: 09/30/2018  OT End of Session - 09/30/18 1029    Visit Number  3    Number of Visits  10    Date for OT Re-Evaluation  11/15/18    Authorization Type  Medicaid--awaiting authorization    Authorization - Visit Number  1    Authorization - Number of Visits  3   BY 10/16/18   OT Start Time  0932    OT Stop Time  1015    OT Time Calculation (min)  43 min    Activity Tolerance  Patient tolerated treatment well    Behavior During Therapy  [facility] for tasks assessed/performed       History reviewed. No pertinent past medical history.  Past Surgical History:  Procedure Laterality Date  . TUBAL LIGATION  2009    There were no vitals filed for this visit.  Subjective Assessment - 09/30/18 0933    Subjective   worse at night - reports 10/10 pain/numbness in right hand     Pertinent History  PMH:  hx of R shoulder pain/stiffness (MD gave pt ex approx 1 year ago)    Patient Stated Goals  strengthen hands, more mobility in hands without dropping     Currently in Pain?  No/denies    Pain Score  0-No pain                   OT Treatments/Exercises (OP) - 09/30/18 0001      ADLs   Grooming  Patient said she can now brush daughter's hair, but has difficulty securing it in ponytail- discussed less resistive bands for hair care.      Functional Mobility  Discussed means to keep right elbow straight at night, as sustained elbow flexion leads to painful numbness in right hand.    Cooking  Discussed benefits of using two hands when lifting or pouring off pots while cooking.      Work  Discussed options for hand position and/or cleaning tools that would help keep  more neutral wrist for cleaning tub, discussed benefits of alternating tasks - e.g. sweeping and vacuuming.        Neurological Re-education Exercises   Other Exercises 1  Reviewed conservatve management CTS handout.  Patient had family crisis involving daughter, and was unable to complete exercise program.  Reinforced the importance of completing exercises to point of stretch (not pain) sustaining stretch, and completing each exercise slowly.  Patient needed verbal and demonstration cueing for shoulder position right to reduce shoulder elevation with exercise.      Other Exercises 2  Patient with report of lateral elbow pain - point tender - will monitor.        Manual Therapy   Manual therapy comments  AROM in right wrist 60*, left wrist 70*. Gentle traction to mobilize carpal bones followed with active motion to 63* right wrist             OT Education - 09/30/18 1028    Education Details  Reviewed CTS conservative management handout with patient.  Discussed options for work / ADL modification, splint schedule with goal to reduce splint usage, and HEP.      Person(s) Educated  Patient  Methods  Explanation;Demonstration;Verbal cues;Handout;Tactile cues    Comprehension  Verbalized understanding;Returned demonstration;Verbal cues required;Need further instruction       OT Short Term Goals - 09/30/18 1034      OT SHORT TERM GOAL #1   Title  Pt will be independent with inital HEP to decr pain/parasthesias and prevent stiffness.--check STGs 10/16/18    Baseline  dependent    Status  On-going      OT SHORT TERM GOAL #2   Title  Pt will be independent with updated splint wear/care schedule.    Baseline  dependent, only currently wearing during the day    Status  Achieved      OT SHORT TERM GOAL #3   Title  Pt will verbalize understanding of activity modifications/positions to avoid to decr pain/parasthesias.    Baseline  dependent    Status  On-going        OT Long Term  Goals - 09/30/18 1034      OT LONG TERM GOAL #1   Title  Pt will be independent with updated HEP.--check LTGs 11/15/18    Baseline  dependent    Status  On-going      OT LONG TERM GOAL #2   Title  Pt will report bilateral hand pain less than or equal to 2/10 with ADLs/IADLs and work tasks.    Baseline  5-6/10    Status  On-going      OT LONG TERM GOAL #3   Title  Pt will demo at least 15lbs dominant R lateral pinch strength for ADL/IADL tasks.    Baseline  R-12lbs (with pain), L-17lbs    Status  On-going      OT LONG TERM GOAL #4   Title  Pt will demo at least 14lbs dominant R 3point pinch strength to assist with picking up objects and decr drops.    Baseline  R-9lbs (with pain), L-16lbs    Status  On-going            Plan - 09/30/18 1030    Clinical Impression Statement  Patient reports that she had a difficult family situation which kept her from completing HEP regularly.  Situation is resolving, patient appears very motivated for improved functional use especially of her dominant right hand.      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    Occupational performance deficits (Please refer to evaluation for details):  ADL's;IADL's;Rest and Sleep;Work;Leisure;Social Participation    Rehab Potential  Good    OT Treatment/Interventions  Self-care/ADL training;Cryotherapy;Paraffin;Therapeutic exercise;DME and/or AE instruction;Splinting;Manual Therapy;Neuromuscular education;Fluidtherapy;Ultrasound;Electrical Stimulation;Moist Heat;Iontophoresis;Contrast Bath;Energy conservation;Passive range of motion;Therapeutic activities;Patient/family education    Plan  Continue to progress HEP, modify work and ADL/IADL tasks, address sleep positioning, consider exercise  program (we discussed swimming)     Clinical Decision Making  Limited treatment options, no task modification necessary    OT Home Exercise Plan  education provided:  basic CTS education/initial HEP/basic positions and activities to avoid, avoid repetitive/prolonged elbow flex and pressure on R elbow    Consulted and Agree with Plan of Care  Patient       Patient will benefit from skilled therapeutic intervention in order to improve the following deficits and impairments:  Impaired sensation, Pain, Improper body mechanics, Decreased knowledge of use of DME, Decreased strength, Impaired UE functional use,  Decreased activity tolerance  Visit Diagnosis: Pain in right hand  Other disturbances of skin sensation  Muscle weakness (generalized)  Pain in left hand    Problem List Patient Active Problem List   Diagnosis Date Noted  . Well woman exam 10/20/2015  . 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

## 2018-10-07 ENCOUNTER — Encounter: Payer: Self-pay | Admitting: Occupational Therapy

## 2018-10-07 ENCOUNTER — Ambulatory Visit: Payer: Medicaid Other | Admitting: Occupational Therapy

## 2018-10-07 DIAGNOSIS — M79642 Pain in left hand: Secondary | ICD-10-CM

## 2018-10-07 DIAGNOSIS — M6281 Muscle weakness (generalized): Secondary | ICD-10-CM

## 2018-10-07 DIAGNOSIS — R208 Other disturbances of skin sensation: Secondary | ICD-10-CM

## 2018-10-07 DIAGNOSIS — M79641 Pain in right hand: Secondary | ICD-10-CM | POA: Diagnosis not present

## 2018-10-07 DIAGNOSIS — M25521 Pain in right elbow: Secondary | ICD-10-CM

## 2018-10-07 NOTE — Patient Instructions (Addendum)
CARPAL TUNNEL (Nerve Compression Syndrome): Horizontal Adductor Stretch (Standing)    Place right hand on wall/doorframe. Inhaling, turn torso toward left. Hold position for 20-30sec Repeat 5 times. Repeat with left hand on wall. Do 2 times per day.      Hold a ball/shoe box/pillow (shoulder width sized) on tops of legs with arms straight and hands flat on either side. Slowly move arms up/back in an arc with elbows straight and then slowly lower back down. Keep shoulders away from ears. Repeat 15x, 2x/day

## 2018-10-07 NOTE — Therapy (Signed)
[facility] [address] [place], [place], [address] Phone: [phone]   Fax:  [phone]  Occupational Therapy Treatment  Patient Details  Name: Jocelyn Townsend MRN: [id number] Date of Birth: 31-Mar-1981 Referring Provider (OT): Dr. Willey Blade   Encounter Date: 10/07/2018  OT End of Session - 10/07/18 1032    Visit Number  3    Number of Visits  10    Date for OT Re-Evaluation  11/15/18    Authorization Type  Medicaid--awaiting authorization    Authorization - Visit Number  2    Authorization - Number of Visits  3    OT Start Time  1021    OT Stop Time  1100    OT Time Calculation (min)  39 min    Activity Tolerance  Patient tolerated treatment well    Behavior During Therapy  [facility] for tasks assessed/performed       History reviewed. No pertinent past medical history.  Past Surgical History:  Procedure Laterality Date  . TUBAL LIGATION  2009    There were no vitals filed for this visit.  Subjective Assessment - 10/07/18 1024    Subjective   Pt reports that 6/10 at worst last 2 days in RUE, pain is improving.  L hand is doing good.    Pertinent History  PMH:  hx of R shoulder pain/stiffness (MD gave pt ex approx 1 year ago)    Patient Stated Goals  strengthen hands, more mobility in hands without dropping     Currently in Pain?  Yes    Pain Score  3     Pain Location  Wrist    Pain Orientation  Right    Pain Descriptors / Indicators  Sharp    Pain Type  Chronic pain    Pain Onset  More than a month ago    Pain Frequency  Intermittent    Aggravating Factors   sleeping at night, gripping with right hand     Pain Relieving Factors  splints       Self Care:  Educated pt in bed positioning to reduce symptoms/pain including recommendation to sleep on L side vs. Stomach.  Recommended supporting RUE on pillows with elbow only slightly bent.  Pt verbalized understanding (may consider elbow splint for night wear  if pain does not improve with positioning strategies and HEP).  Continued activity modifications including using wt. Shift/body weight to help with vacuuming, strategies for carrying objects, carrying pans, avoiding scrubbing (using abrasive sponge/brush or letting things soak), switching to LUE as able.   Manual:  Soft tissue mobs to forearm, primarily lateral and dorsal.  Gentle wrist mobs and carpal stretch.      OT Education - 10/07/18 2211    Education Details  Reviewed CTS HEP and added ex #3 (wrist flex/ext with elbow ext) and shoulder ER doorway stretch and shoulder flex AAROM with ball--see pt instructions    Person(s) Educated  Patient    Methods  Explanation;Demonstration;Verbal cues;Handout    Comprehension  Verbalized understanding;Returned demonstration;Verbal cues required       OT Short Term Goals - 09/30/18 1034      OT SHORT TERM GOAL #1   Title  Pt will be independent with inital HEP to decr pain/parasthesias and prevent stiffness.--check STGs 10/16/18    Baseline  dependent    Status  On-going      OT SHORT TERM GOAL #2   Title  Pt will be independent with  updated splint wear/care schedule.    Baseline  dependent, only currently wearing during the day    Status  Achieved      OT SHORT TERM GOAL #3   Title  Pt will verbalize understanding of activity modifications/positions to avoid to decr pain/parasthesias.    Baseline  dependent    Status  On-going        OT Long Term Goals - 09/30/18 1034      OT LONG TERM GOAL #1   Title  Pt will be independent with updated HEP.--check LTGs 11/15/18    Baseline  dependent    Status  On-going      OT LONG TERM GOAL #2   Title  Pt will report bilateral hand pain less than or equal to 2/10 with ADLs/IADLs and work tasks.    Baseline  5-6/10    Status  On-going      OT LONG TERM GOAL #3   Title  Pt will demo at least 15lbs dominant R lateral pinch strength for ADL/IADL tasks.    Baseline  R-12lbs (with pain),  L-17lbs    Status  On-going      OT LONG TERM GOAL #4   Title  Pt will demo at least 14lbs dominant R 3point pinch strength to assist with picking up objects and decr drops.    Baseline  R-9lbs (with pain), L-16lbs    Status  On-going            Plan - 10/07/18 2214    Clinical Impression Statement  Pt is progressing towards goals with improving pain this week.  Pt reports that HEP is going well now and verbalized understanding of HEP updates after instructed today.    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    Occupational performance deficits (Please refer to evaluation for details):  ADL's;IADL's;Rest and Sleep;Work;Leisure;Social Participation    Rehab Potential  Good    OT Treatment/Interventions  Self-care/ADL training;Cryotherapy;Paraffin;Therapeutic exercise;DME and/or AE instruction;Splinting;Manual Therapy;Neuromuscular education;Fluidtherapy;Ultrasound;Electrical Stimulation;Moist Heat;Iontophoresis;Contrast Bath;Energy conservation;Passive range of motion;Therapeutic activities;Patient/family education    Plan  Continue to progress HEP, education regarding modification of work and ADL/IADL tasks prn, consider elbow splint for night if pt continues to report elbow pain and 5th digit numbness    Clinical Decision Making  Limited treatment options, no task modification necessary    OT Home Exercise Plan  education provided:  basic CTS education/initial HEP/basic positions and activities to avoid, avoid repetitive/prolonged elbow flex and pressure on R elbow; updates to HEP and activity modification, bed positioning    Consulted and Agree with Plan of Care  Patient       Patient will benefit from skilled therapeutic intervention in order to improve the  following deficits and impairments:  Impaired sensation, Pain, Improper body mechanics, Decreased knowledge of use of DME, Decreased strength, Impaired UE functional use, Decreased activity tolerance  Visit Diagnosis: Pain in right hand  Other disturbances of skin sensation  Muscle weakness (generalized)  Pain in left hand  Pain in right elbow    Problem List Patient Active Problem List   Diagnosis Date Noted  . Well woman exam 10/20/2015  . 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 [place], [place]  [address] [phone] phone [phone] 10/07/18 10:26 PM

## 2018-10-14 ENCOUNTER — Ambulatory Visit: Payer: Medicaid Other | Admitting: Occupational Therapy

## 2018-10-14 ENCOUNTER — Encounter: Payer: Self-pay | Admitting: Occupational Therapy

## 2018-10-14 DIAGNOSIS — M79642 Pain in left hand: Secondary | ICD-10-CM

## 2018-10-14 DIAGNOSIS — M25521 Pain in right elbow: Secondary | ICD-10-CM

## 2018-10-14 DIAGNOSIS — M79641 Pain in right hand: Secondary | ICD-10-CM | POA: Diagnosis not present

## 2018-10-14 DIAGNOSIS — R208 Other disturbances of skin sensation: Secondary | ICD-10-CM

## 2018-10-14 DIAGNOSIS — M6281 Muscle weakness (generalized): Secondary | ICD-10-CM

## 2018-10-14 NOTE — Patient Instructions (Signed)
Composite Extension (Passive Flexor Stretch)    Sitting with elbows on table and palms together, slowly lower wrists toward table until stretch is felt. Be sure to keep palms together throughout stretch. Hold 20 seconds. Relax. Repeat 5 times. Do 4 sessions per day.

## 2018-10-14 NOTE — Therapy (Addendum)
[facility] [address] [place], [place], [address] Phone: [phone]   Fax:  [phone]  Occupational Therapy Treatment  Patient Details  Name: Jocelyn Townsend MRN: [id number] Date of Birth: [birth year] Referring Provider (OT): Dr. Kevan Ny   Encounter Date: 10/14/2018  OT End of Session - 10/14/18 0939    Visit Number  4    Number of Visits  10    Date for OT Re-Evaluation  11/15/18    Authorization Type  Medicaid--awaiting authorization for additional visits (requested 10/14/18)    Authorization Time Period  3 OT visits approved 09/26/18-10/16/18    Authorization - Visit Number  3    Authorization - Number of Visits  3    OT Start Time  0936    OT Stop Time  1009    OT Time Calculation (min)  33 min    Activity Tolerance  Patient tolerated treatment well    Behavior During Therapy  [facility] for tasks assessed/performed       History reviewed. No pertinent past medical history.  Past Surgical History:  Procedure Laterality Date  . TUBAL LIGATION  2009    There were no vitals filed for this visit.  Subjective Assessment - 10/14/18 0938    Subjective   Worst pain in last 2 days 2/10.   Pt reports that exercises (for carpal tunnel and shoulder) and positioning/activity modification really have helped.  Pt report no R shoulder, elbow, wrist pain today and no L wrist pain.  2/10 pain when tossing trash bag in dumpster    Pertinent History  PMH:  hx of R shoulder pain/stiffness (MD gave pt ex approx 1 year ago)    Patient Stated Goals  strengthen hands, more mobility in hands without dropping     Currently in Pain?  No/denies    Pain Onset  More than a month ago         Fludio:  x72mn to R hand/wrist for stiffness in prep for exercise with no adverse reactions.        OT Education - 10/14/18 1011    Education Details  Reviewed current HEP and added CTS HEP ex #4 (elbow ext with head tilt) and added prayer  stretch for composite wrist/finger ext--pt returned demo each    Person(s) Educated  Patient    Methods  Explanation;Demonstration;Verbal cues;Handout    Comprehension  Verbalized understanding;Returned demonstration   occasional min v.c. for positioning      OT Short Term Goals - 10/14/18 0939      OT SHORT TERM GOAL #1   Title  Pt will be independent with inital HEP to decr pain/parasthesias and prevent stiffness.--check STGs 10/16/18    Baseline  dependent    Status  Achieved, met 10/14/18, independent     OT SHORT TERM GOAL #2   Title  Pt will be independent with updated splint wear/care schedule.    Baseline  dependent, only currently wearing during the day    Status  Achieved, met 10/14/18, independent     OT SHORT TERM GOAL #3   Title  Pt will verbalize understanding of activity modifications/positions to avoid to decr pain/parasthesias.    Baseline  dependent    Status  Achieved, met 10/14/18, independent       OT Long Term Goals - 10/14/18 0940      OT LONG TERM GOAL #1   Title  Pt will be independent with updated HEP.--check LTGs 11/15/18  Baseline  dependent    Status  On-going, 10/14/18, continue goal/not yet met. Pt not yet appropriate for strengthening/not fully addressed     OT LONG TERM GOAL #2   Title  Pt will report bilateral hand pain less than or equal to 2/10 with ADLs/IADLs and work tasks.    Baseline  5-6/10    Status  On-going   10/14/18:  0-2/10 last 2 days, will monitor for consistency     OT LONG TERM GOAL #3   Title  Pt will demo at least 15lbs dominant R lateral pinch strength for ADL/IADL tasks.    Baseline  R-12lbs (with pain), L-17lbs    Status  On-going , 10/14/18, continue goal/not yet met.  Strengthening not yet addressed due to pain     OT LONG TERM GOAL #4   Title  Pt will demo at least 14lbs dominant R 3point pinch strength to assist with picking up objects and decr drops.    Baseline  R-9lbs (with pain), L-16lbs    Status   On-going, 10/14/18, continue goal/not yet met.  Strengthening not yet addressed due to pain           Plan - 10/14/18 0939    Clinical Impression Statement Pt reports that she has significantly less pain this week (will monitor to see if this continues prior to beginning gentle strengthening or weaning from splint).  Pt reports that positioning, activity modifications, HEP, and splint wear are significantly improving pain.  Pt was seen for 3 treatment visits and has met all STGs and is making progress toward LTGs (particularly pain goal).  Pt would benefit from additional occupational therapy to address LTGs with focus on continued pain reduction, prevention of future complication, and improved ADL/IADL performance.  Occupational therapy is needed for education regarding proper progression of strengthening when appropriate and weaning of splint.  Pt has responded well to treatment with pain/symptom reduction.    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    Occupational performance deficits (Please refer to evaluation for details):  ADL's;IADL's;Rest and Sleep;Work;Leisure;Social Participation    Rehab Potential  Good    OT Frequency  1x / week    OT Treatment/Interventions  Self-care/ADL training;Cryotherapy;Paraffin;Therapeutic exercise;DME and/or AE instruction;Splinting;Manual Therapy;Neuromuscular education;Fluidtherapy;Ultrasound;Electrical Stimulation;Moist Heat;Iontophoresis;Contrast Bath;Energy conservation;Passive range of motion;Therapeutic activities;Patient/family education    Plan  Continue to progress HEP and monitor pain (if pain continues to decr, consider gentle strengthening)    Clinical Decision Making  Limited treatment options, no task modification necessary     OT Home Exercise Plan  education provided:  basic CTS education/initial HEP/basic positions and activities to avoid, avoid repetitive/prolonged elbow flex and pressure on R elbow; updates to HEP and activity modification, bed positioning    Consulted and Agree with Plan of Care  Patient       Patient will benefit from skilled therapeutic intervention in order to improve the following deficits and impairments:  Impaired sensation, Pain, Improper body mechanics, Decreased knowledge of use of DME, Decreased strength, Impaired UE functional use, Decreased activity tolerance  Visit Diagnosis: Pain in right hand  Muscle weakness (generalized)  Pain in left hand  Pain in right elbow  Other disturbances of skin sensation    Problem List Patient Active Problem List   Diagnosis Date Noted  . Well woman exam 10/20/2015  .  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 [place], [place], [address] Phone: [phone]   Fax:  [phone]  Name: Mackenna Kamer MRN: [id number] Date of Birth: September 23, 1981   Vianne Bulls, OTR/L [facility] [address] [place], [place]  [address] [phone] phone [phone] 10/14/18 10:19 AM

## 2018-10-23 ENCOUNTER — Encounter: Payer: Self-pay | Admitting: Occupational Therapy

## 2018-10-23 ENCOUNTER — Ambulatory Visit: Payer: Medicaid Other | Attending: Internal Medicine | Admitting: Occupational Therapy

## 2018-10-23 DIAGNOSIS — M79642 Pain in left hand: Secondary | ICD-10-CM

## 2018-10-23 DIAGNOSIS — M25521 Pain in right elbow: Secondary | ICD-10-CM

## 2018-10-23 DIAGNOSIS — M79641 Pain in right hand: Secondary | ICD-10-CM

## 2018-10-23 DIAGNOSIS — R208 Other disturbances of skin sensation: Secondary | ICD-10-CM | POA: Diagnosis present

## 2018-10-23 DIAGNOSIS — M6281 Muscle weakness (generalized): Secondary | ICD-10-CM

## 2018-10-23 NOTE — Therapy (Signed)
[facility] [address] [place], [place], [address] Phone: [phone]   Fax:  [phone]  Occupational Therapy Treatment  Patient Details  Name: Jocelyn Townsend MRN: [id number] Date of Birth: December 24, 1980 Referring Provider (OT): Dr. Kevan Ny   Encounter Date: 10/23/2018  OT End of Session - 10/23/18 1119    Visit Number  5    Number of Visits  10    Date for OT Re-Evaluation  11/15/18    Authorization Type  Medicaid    Authorization Time Period  3 OT visits approved 09/26/18-10/16/18; approved additional 3 visits 10/23/18-11/12/18    Authorization - Visit Number  1    Authorization - Number of Visits  3    OT Start Time  9798    OT Stop Time  1047    OT Time Calculation (min)  32 min    Activity Tolerance  Patient tolerated treatment well    Behavior During Therapy  [employer] for tasks assessed/performed       History reviewed. No pertinent past medical history.  Past Surgical History:  Procedure Laterality Date  . TUBAL LIGATION  2009    There were no vitals filed for this visit.  Subjective Assessment - 10/23/18 1107    Subjective   Worst pain in last 4 days 1.5/10.   Pt reports that she has no pain today, and only had pain 1x in last 4 days due to turning wrist wrong.  Pt reports that she has had no numbness/tingling now either.    Pertinent History  PMH:  hx of R shoulder pain/stiffness (MD gave pt ex approx 1 year ago)    Patient Stated Goals  strengthen hands, more mobility in hands without dropping     Currently in Pain?  No/denies    Pain Onset  More than a month ago        Fludio:  x34mn to R hand/wrist for stiffness in prep for exercise with no adverse reactions.     Reviewed current CTS HEP and shoulder flex with ball and ER door stretch.   Pt returned demo each.    Educated pt in red putty HEP for individual finger pinch (roll and pinch 3x), isolated IP flexion (x10), gross grasp (x15), and  lateral pinch (x10).  Pt verbalized understanding and returned demo each.  Pt to perform all 2x/day and continue previous HEP.   Pt to stop if pain increases.  Education regarding how to monitor pain and to begin to wean from splint for light tasks.  Pt to continue to wear splint for work and heavier tasks, or repetitive gripping/pinching.  Pt to incr splint wear again if pain incr with attempts to wean.  Pt verbalized understanding.         OT Education - 10/23/18 1114    Education Details  red putty HEP for individual finger pinch, isolated IP flexion, gross grasp, and lateral pinch    Person(s) Educated  Patient    Methods  Explanation;Demonstration;Verbal cues;Handout    Comprehension  Verbalized understanding;Returned demonstration       OT Short Term Goals - 10/14/18 0939      OT SHORT TERM GOAL #1   Title  Pt will be independent with inital HEP to decr pain/parasthesias and prevent stiffness.--check STGs 10/16/18    Baseline  dependent    Status  Achieved   10/14/18:  met, independent     OT SHORT TERM GOAL #2   Title  Pt  will be independent with updated splint wear/care schedule.    Baseline  dependent, only currently wearing during the day    Status  Achieved   10/14/18:  met, independent     OT SHORT TERM GOAL #3   Title  Pt will verbalize understanding of activity modifications/positions to avoid to decr pain/parasthesias.    Baseline  dependent    Status  Achieved   10/14/18  Met, independent       OT Long Term Goals - 10/23/18 1124      OT LONG TERM GOAL #1   Title  Pt will be independent with updated HEP.--check LTGs 11/15/18    Baseline  dependent    Status  On-going   10/14/18:  not met/continue goal, not yet met, will need updates once pt is appropriate for strengthening     OT LONG TERM GOAL #2   Title  Pt will report bilateral hand pain less than or equal to 2/10 with ADLs/IADLs and work tasks.    Baseline  5-6/10    Status  On-going    10/14/18:  not consistently met/continue goal, 0-2/10 last 2 days, will monitor for consistency      OT LONG TERM GOAL #3   Title  Pt will demo at least 15lbs dominant R lateral pinch strength for ADL/IADL tasks.    Baseline  R-12lbs (with pain), L-17lbs    Status  On-going   10/14/18:  not met/continue goal, strengthening not yet addressed due to pain     OT LONG TERM GOAL #4   Title  Pt will demo at least 14lbs dominant R 3point pinch strength to assist with picking up objects and decr drops.    Baseline  R-9lbs (with pain), L-16lbs    Status  On-going   10/14/18:  not met/continue goal, stengthening not yet addressed due to pain           Plan - 10/23/18 1120    Clinical Impression Statement  Pt reports pain is improved and only occasional mild 1.5/10 pain now in wrist with if turns her wrist wrong.   Pt tolerated initiation of strengthening well with no reports of pain today.  Pt continues to make good progress towards goals.    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    Occupational performance deficits (Please refer to evaluation for details):  ADL's;IADL's;Rest and Sleep;Work;Leisure;Social Participation    Rehab Potential  Good    OT Frequency  1x / week    OT Treatment/Interventions  Self-care/ADL training;Cryotherapy;Paraffin;Therapeutic exercise;DME and/or AE instruction;Splinting;Manual Therapy;Neuromuscular education;Fluidtherapy;Ultrasound;Electrical Stimulation;Moist Heat;Iontophoresis;Contrast Bath;Energy conservation;Passive range of motion;Therapeutic activities;Patient/family education    Plan  Continue to monitor pain and continue with gentle strengthening (consider adding gentle wrist strengthening)    Clinical Decision Making  Limited  treatment options, no task modification necessary    OT Home Exercise Plan  education provided:  basic CTS education/initial HEP/basic positions and activities to avoid, avoid repetitive/prolonged elbow flex and pressure on R elbow; updates to HEP and activity modification, bed positioning    Consulted and Agree with Plan of Care  Patient       Patient will benefit from skilled therapeutic intervention in order to improve the following deficits and impairments:  Impaired sensation, Pain, Improper body mechanics, Decreased knowledge of use of DME, Decreased strength, Impaired  UE functional use, Decreased activity tolerance  Visit Diagnosis: Muscle weakness (generalized)  Pain in right hand  Pain in left hand  Pain in right elbow  Other disturbances of skin sensation    Problem List Patient Active Problem List   Diagnosis Date Noted  . Well woman exam 10/20/2015  . 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 [place], [place], [address] Phone: [phone]   Fax:  [phone]  Name: Jocelyn Townsend MRN: [id number] Date of Birth: 1981-10-15   Vianne Bulls, OTR/L [facility] [address] [place], [place]  [address] [phone] phone [phone] 10/23/18 11:25 AM

## 2018-10-28 ENCOUNTER — Ambulatory Visit: Payer: Medicaid Other | Admitting: Occupational Therapy

## 2018-10-28 DIAGNOSIS — M79642 Pain in left hand: Secondary | ICD-10-CM

## 2018-10-28 DIAGNOSIS — M6281 Muscle weakness (generalized): Secondary | ICD-10-CM | POA: Diagnosis not present

## 2018-10-28 DIAGNOSIS — M79641 Pain in right hand: Secondary | ICD-10-CM

## 2018-10-28 DIAGNOSIS — M25521 Pain in right elbow: Secondary | ICD-10-CM

## 2018-10-28 DIAGNOSIS — R208 Other disturbances of skin sensation: Secondary | ICD-10-CM

## 2018-10-28 NOTE — Patient Instructions (Addendum)
AROM: Wrist Extension   .  With __right__ palm down, bend wrist up. Hold 1 lbs weight or can Repeat __15__ times per set.  Do __1-2_ sessions per day.    AROM: Wrist Flexion    Hold 1 lbs can or weight bend wrist up/ down 10-15 reps 1-2 x day

## 2018-10-28 NOTE — Therapy (Signed)
[facility] [address] [place], [place], [address] Phone: [phone]   Fax:  [phone]  Occupational Therapy Treatment  Patient Details  Name: Jocelyn Townsend MRN: [id number] Date of Birth: [birth year] Referring Provider (OT): Dr. Kevan Ny   Encounter Date: 10/28/2018  OT End of Session - 10/28/18 0812    Visit Number  6    Number of Visits  10    Date for OT Re-Evaluation  11/15/18    Authorization Type  Medicaid    Authorization Time Period  3 OT visits approved 09/26/18-10/16/18; approved additional 3 visits 10/23/18-11/12/18    Authorization - Visit Number  2    Authorization - Number of Visits  3    OT Start Time  0807    OT Stop Time  0845    OT Time Calculation (min)  38 min       No past medical history on file.  Past Surgical History:  Procedure Laterality Date  . TUBAL LIGATION  2009    There were no vitals filed for this visit.  Subjective Assessment - 10/28/18 0811    Pertinent History  PMH:  hx of R shoulder pain/stiffness (MD gave pt ex approx 1 year ago)    Patient Stated Goals  strengthen hands, more mobility in hands without dropping     Currently in Pain?  No/denies                 Fludio:  x96mn to R hand/wrist for stiffness in prep for exercise with no adverse reactions.   Reviewed prayer stretch, and tendon gliding exercises.  Reviewed red putty HEP for individual finger pinch (roll and pinch 3x), isolated IP flexion (x10), gross grasp (x15), and lateral pinch (x10).  Pt verbalized understanding and returned demo each.  Reviewed doorway stretch and shoulder flexion with ball 10 reps each Arm bike x 5 mins level 3 for conditioning            OT Education - 10/28/18 0842    Education Details  reviewed red putty HEP for individual finger pinch, isolated IP flexion, gross grasp, and lateral pinch, prayer stretch, and issued wrist strengthening with a 1 lbs weight see  pt instructions    Person(s) Educated  Patient    Methods  Explanation;Demonstration;Verbal cues;Handout    Comprehension  Verbalized understanding;Returned demonstration       OT Short Term Goals - 10/14/18 0939      OT SHORT TERM GOAL #1   Title  Pt will be independent with inital HEP to decr pain/parasthesias and prevent stiffness.--check STGs 10/16/18    Baseline  dependent    Status  Achieved   10/14/18:  met, independent     OT SHORT TERM GOAL #2   Title  Pt will be independent with updated splint wear/care schedule.    Baseline  dependent, only currently wearing during the day    Status  Achieved   10/14/18:  met, independent     OT SHORT TERM GOAL #3   Title  Pt will verbalize understanding of activity modifications/positions to avoid to decr pain/parasthesias.    Baseline  dependent    Status  Achieved   10/14/18  Met, independent       OT Long Term Goals - 10/23/18 1124      OT LONG TERM GOAL #1   Title  Pt will be independent with updated HEP.--check LTGs 11/15/18    Baseline  dependent  Status  On-going   10/14/18:  not met/continue goal, not yet met, will need updates once pt is appropriate for strengthening     OT LONG TERM GOAL #2   Title  Pt will report bilateral hand pain less than or equal to 2/10 with ADLs/IADLs and work tasks.    Baseline  5-6/10    Status  On-going   10/14/18:  not consistently met/continue goal, 0-2/10 last 2 days, will monitor for consistency      OT LONG TERM GOAL #3   Title  Pt will demo at least 15lbs dominant R lateral pinch strength for ADL/IADL tasks.    Baseline  R-12lbs (with pain), L-17lbs    Status  On-going   10/14/18:  not met/continue goal, strengthening not yet addressed due to pain     OT LONG TERM GOAL #4   Title  Pt will demo at least 14lbs dominant R 3point pinch strength to assist with picking up objects and decr drops.    Baseline  R-9lbs (with pain), L-16lbs    Status  On-going   10/14/18:  not  met/continue goal, stengthening not yet addressed due to pain           Plan - 10/28/18 0813    Clinical Impression Statement  Pt is progressing towards goals. Pt's pain has decreased significantly and she is now able to tolerate strengthening.    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    Occupational performance deficits (Please refer to evaluation for details):  ADL's;IADL's;Rest and Sleep;Work;Leisure;Social Participation    Rehab Potential  Good    OT Frequency  1x / week    OT Treatment/Interventions  Self-care/ADL training;Cryotherapy;Paraffin;Therapeutic exercise;DME and/or AE instruction;Splinting;Manual Therapy;Neuromuscular education;Fluidtherapy;Ultrasound;Electrical Stimulation;Moist Heat;Iontophoresis;Contrast Bath;Energy conservation;Passive range of motion;Therapeutic activities;Patient/family education    Plan  Continue to monitor pain and continue with gentle strengthening,     Clinical Decision Making  Limited treatment options, no task modification necessary    OT Home Exercise Plan  education provided:  basic CTS education/initial HEP/basic positions and activities to avoid, avoid repetitive/prolonged elbow flex and pressure on R elbow; updates to HEP and activity modification, bed positioning    Consulted and Agree with Plan of Care  Patient       Patient will benefit from skilled therapeutic intervention in order to improve the following deficits and impairments:  Impaired sensation, Pain, Improper body mechanics, Decreased knowledge of use of DME, Decreased strength, Impaired UE functional use, Decreased activity tolerance  Visit Diagnosis: Pain in right hand  Muscle weakness (generalized)  Pain in left hand  Pain in right elbow  Other  disturbances of skin sensation    Problem List Patient Active Problem List   Diagnosis Date Noted  . Well woman exam 10/20/2015  . 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 [place], [place], [address] Phone: [phone]   Fax:  [phone]  Name: Jocelyn Townsend MRN: [id number] Date of Birth: Jul 05, 1981

## 2018-11-03 ENCOUNTER — Encounter: Payer: Self-pay | Admitting: Occupational Therapy

## 2018-11-03 ENCOUNTER — Ambulatory Visit: Payer: Medicaid Other | Admitting: Occupational Therapy

## 2018-11-03 DIAGNOSIS — M6281 Muscle weakness (generalized): Secondary | ICD-10-CM | POA: Diagnosis not present

## 2018-11-03 DIAGNOSIS — M79641 Pain in right hand: Secondary | ICD-10-CM

## 2018-11-03 DIAGNOSIS — M79642 Pain in left hand: Secondary | ICD-10-CM

## 2018-11-03 DIAGNOSIS — M25521 Pain in right elbow: Secondary | ICD-10-CM

## 2018-11-03 NOTE — Therapy (Signed)
[facility] [address] [place], [place], [address] Phone: [phone]   Fax:  [phone]  Occupational Therapy Treatment  Patient Details  Name: Jocelyn Townsend MRN: [id number] Date of Birth: 1981/06/02 Referring Provider (OT): Dr. Kevan Ny   Encounter Date: 11/03/2018  OT End of Session - 11/03/18 1022    Visit Number  7    Number of Visits  10    Date for OT Re-Evaluation  11/15/18    Authorization Type  Medicaid    Authorization Time Period  3 OT visits approved 09/26/18-10/16/18; approved additional 3 visits 10/23/18-11/12/18    Authorization - Visit Number  3    Authorization - Number of Visits  3    OT Start Time  1019    OT Stop Time  1057    OT Time Calculation (min)  38 min    Activity Tolerance  Patient tolerated treatment well    Behavior During Therapy  [facility] for tasks assessed/performed       History reviewed. No pertinent past medical history.  Past Surgical History:  Procedure Laterality Date  . TUBAL LIGATION  2009    There were no vitals filed for this visit.  Subjective Assessment - 11/03/18 1022    Subjective   No pain, exercises are going well    Pertinent History  PMH:  hx of R shoulder pain/stiffness (MD gave pt ex approx 1 year ago)    Patient Stated Goals  strengthen hands, more mobility in hands without dropping     Currently in Pain?  No/denies    Pain Onset  More than a month ago        Fludio: x55mn to R hand/wrist for stiffness in prep for exercise with no adverse reactions.  Reviewed prayer stretch, and tendon gliding exercises. Pt returned demo each  Reviewed red putty HEP for individual finger pinch(roll and pinch 3x), isolated IP flexion(x10), gross grasp(x15), and lateral pinch(x10). Pt verbalized understanding and returned demo each.   Wrist flex/ext with 1b wt x10 then 2lb wt x10 each   Arm bike x 6 mins level 3 for conditioning  Checked goals and  discussed progress--see below.   Recommended pt continue with HEP another 2-3 weeks and wean from splint, but to incr splint wear/HEP again if she has pain in the future.  Pt verbalized understanding of all education provided.    OT Short Term Goals - 10/14/18 0939      OT SHORT TERM GOAL #1   Title  Pt will be independent with inital HEP to decr pain/parasthesias and prevent stiffness.--check STGs 10/16/18    Baseline  dependent    Status  Achieved   10/14/18:  met, independent     OT SHORT TERM GOAL #2   Title  Pt will be independent with updated splint wear/care schedule.    Baseline  dependent, only currently wearing during the day    Status  Achieved   10/14/18:  met, independent     OT SHORT TERM GOAL #3   Title  Pt will verbalize understanding of activity modifications/positions to avoid to decr pain/parasthesias.    Baseline  dependent    Status  Achieved   10/14/18  Met, independent       OT Long Term Goals - 11/03/18 1024      OT LONG TERM GOAL #1   Title  Pt will be independent with updated HEP.--check LTGs 11/15/18    Baseline  dependent  Status  Achieved   10/14/18:  not met/continue goal, not yet met, will need updates once pt is appropriate for strengthening.  11/03/18:  met     OT LONG TERM GOAL #2   Title  Pt will report bilateral hand pain less than or equal to 2/10 with ADLs/IADLs and work tasks.    Baseline  5-6/10    Status  Achieved   10/14/18:  not consistently met/continue goal, 0-2/10 last 2 days, will monitor for consistency .  11/03/18:  no pain     OT LONG TERM GOAL #3   Title  Pt will demo at least 15lbs dominant R lateral pinch strength for ADL/IADL tasks.    Baseline  R-12lbs (with pain), L-17lbs    Status  Achieved   10/14/18:  not met/continue goal, strengthening not yet addressed due to pain.  11/03/18:  20lbs with no pain     OT LONG TERM GOAL #4   Title  Pt will demo at least 14lbs dominant R 3point pinch strength to assist with  picking up objects and decr drops.    Baseline  R-9lbs (with pain), L-16lbs    Status  Achieved   10/14/18:  not met/continue goal, stengthening not yet addressed due to pain.  11/03/18:  15lbs with no pain           Plan - 11/03/18 1023    Clinical Impression Statement  Pt has made excellent progress with no pain and is tolerating strengthening well.  Was able to go without splint over the weekend and had no pain.    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    Occupational performance deficits (Please refer to evaluation for details):  ADL's;IADL's;Rest and Sleep;Work;Leisure;Social Participation    Rehab Potential  Good    OT Frequency  1x / week    OT Treatment/Interventions  Self-care/ADL training;Cryotherapy;Paraffin;Therapeutic exercise;DME and/or AE instruction;Splinting;Manual Therapy;Neuromuscular education;Fluidtherapy;Ultrasound;Electrical Stimulation;Moist Heat;Iontophoresis;Contrast Bath;Energy conservation;Passive range of motion;Therapeutic activities;Patient/family education    Plan  d/c OT    Clinical Decision Making  Limited treatment options, no task modification necessary    OT Home Exercise Plan  education provided:  basic CTS education/initial HEP/basic positions and activities to avoid, avoid repetitive/prolonged elbow flex and pressure on R elbow; updates to HEP and activity modification, bed positioning    Consulted and Agree with Plan of Care  Patient       Patient will benefit from skilled therapeutic intervention in order to improve the following deficits and impairments:  Impaired sensation, Pain, Improper body mechanics, Decreased knowledge of use of DME, Decreased strength, Impaired UE functional use, Decreased activity tolerance  Visit  Diagnosis: Pain in right hand  Muscle weakness (generalized)  Pain in left hand  Pain in right elbow    Problem List Patient Active Problem List   Diagnosis Date Noted  . Well woman exam 10/20/2015  . 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  Pt verbalized understanding of all education provided.  Plan: Patient agrees to discharge.  Patient  goals were met. Patient is being discharged due to meeting the stated rehab goals.  ?????       [facility] 11/03/2018, 10:55 AM  [facility] [address] [place], [place], [address] Phone: [phone]   Fax:  [phone]  Name: Jocelyn Townsend MRN: [id number] Date of Birth: June 01, 1981   Vianne Bulls, OTR/L [facility] [address] [place], [place]  [address] [phone] phone [phone] 11/03/18 10:55 AM

## 2018-11-14 ENCOUNTER — Encounter: Payer: Medicaid Other | Admitting: Occupational Therapy

## 2018-11-18 ENCOUNTER — Encounter: Payer: Medicaid Other | Admitting: Occupational Therapy

## 2018-11-25 ENCOUNTER — Encounter: Payer: Medicaid Other | Admitting: Occupational Therapy

## 2019-03-16 ENCOUNTER — Encounter: Payer: Self-pay | Admitting: Podiatry

## 2019-03-16 ENCOUNTER — Ambulatory Visit (INDEPENDENT_AMBULATORY_CARE_PROVIDER_SITE_OTHER): Payer: Medicaid Other

## 2019-03-16 ENCOUNTER — Other Ambulatory Visit: Payer: Self-pay

## 2019-03-16 ENCOUNTER — Ambulatory Visit: Payer: Medicaid Other | Admitting: Podiatry

## 2019-03-16 VITALS — BP 126/76 | Temp 97.7°F

## 2019-03-16 DIAGNOSIS — M722 Plantar fascial fibromatosis: Secondary | ICD-10-CM

## 2019-03-16 MED ORDER — METHYLPREDNISOLONE 4 MG PO TBPK: ORAL_TABLET | ORAL | 0 refills | Status: DC

## 2019-03-16 MED ORDER — MELOXICAM 15 MG PO TABS: 15.0000 mg | ORAL_TABLET | Freq: Every day | ORAL | 1 refills | Status: DC

## 2019-03-16 NOTE — Progress Notes (Signed)
   Subjective: 38 y.o. female presents the office today for left heel pain that has been going on for approximately 1 year now.  She states that she has tried ice, Motrin, and stretching exercises to alleviate her heel pain with no significant improvement.  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  She is on her feet a significant portion of her work and she is currently unable to work without pain.  She denies injury.   History reviewed. No pertinent past medical history.   Objective: Physical Exam General: The patient is alert and oriented x3 in no acute distress.  Dermatology: Skin is warm, dry and supple bilateral lower extremities. Negative for open lesions or macerations bilateral.   Vascular: Dorsalis Pedis and Posterior Tibial pulses palpable bilateral.  Capillary fill time is immediate to all digits.  Neurological: Epicritic and protective threshold intact bilateral.   Musculoskeletal: Tenderness to palpation to the plantar aspect of the left heel along the plantar fascia. All other joints range of motion within normal limits bilateral. Strength 5/5 in all groups bilateral.   Radiographic exam: Normal osseous mineralization. Joint spaces preserved. No fracture/dislocation/boney destruction. No other soft tissue abnormalities or radiopaque foreign bodies.   Assessment: 1. Plantar fasciitis left foot  Plan of Care:  1. Patient evaluated. Xrays reviewed.   2. Injection of 0.5cc Celestone soluspan injected into the left plantar fascia.  3. Rx for Medrol Dose Pak placed 4. Rx for Meloxicam ordered for patient. 5. Plantar fascial band(s) dispensed  6. Instructed patient regarding therapies and modalities at home to alleviate symptoms.  7.  Recommend OTC  Powerstep insoles.  8.  Return to clinic in 4 weeks.     Felecia Shelling, DPM [facility]  Dr. Felecia Shelling, DPM    [address] [place], [place] [address]                Office [phone]  Fax [phone]

## 2019-03-16 NOTE — Patient Instructions (Addendum)
Powerstep insoles on [employer]   Plantar Fasciitis (Heel Spur Syndrome) with Rehab The plantar fascia is a fibrous, ligament-like, soft-tissue structure that spans the bottom of the foot. Plantar fasciitis is a condition that causes pain in the foot due to inflammation of the tissue. SYMPTOMS   Pain and tenderness on the underneath side of the foot.  Pain that worsens with standing or walking. CAUSES  Plantar fasciitis is caused by irritation and injury to the plantar fascia on the underneath side of the foot. Common mechanisms of injury include:  Direct trauma to bottom of the foot.  Damage to a small nerve that runs under the foot where the main fascia attaches to the heel bone.  Stress placed on the plantar fascia due to bone spurs. RISK INCREASES WITH:   Activities that place stress on the plantar fascia (running, jumping, pivoting, or cutting).  Poor strength and flexibility.  Improperly fitted shoes.  Tight calf muscles.  Flat feet.  Failure to warm-up properly before activity.  Obesity. PREVENTION  Warm up and stretch properly before activity.  Allow for adequate recovery between workouts.  Maintain physical fitness:  Strength, flexibility, and endurance.  Cardiovascular fitness.  Maintain a health body weight.  Avoid stress on the plantar fascia.  Wear properly fitted shoes, including arch supports for individuals who have flat feet.  PROGNOSIS  If treated properly, then the symptoms of plantar fasciitis usually resolve without surgery. However, occasionally surgery is necessary.  RELATED COMPLICATIONS   Recurrent symptoms that may result in a chronic condition.  Problems of the lower back that are caused by compensating for the injury, such as limping.  Pain or weakness of the foot during push-off following surgery.  Chronic inflammation, scarring, and partial or complete fascia tear, occurring more often from repeated injections.  TREATMENT   Treatment initially involves the use of ice and medication to help reduce pain and inflammation. The use of strengthening and stretching exercises may help reduce pain with activity, especially stretches of the Achilles tendon. These exercises may be performed at home or with a therapist. Your caregiver may recommend that you use heel cups of arch supports to help reduce stress on the plantar fascia. Occasionally, corticosteroid injections are given to reduce inflammation. If symptoms persist for greater than 6 months despite non-surgical (conservative), then surgery may be recommended.   MEDICATION   If pain medication is necessary, then nonsteroidal anti-inflammatory medications, such as aspirin and ibuprofen, or other minor pain relievers, such as acetaminophen, are often recommended.  Do not take pain medication within 7 days before surgery.  Prescription pain relievers may be given if deemed necessary by your caregiver. Use only as directed and only as much as you need.  Corticosteroid injections may be given by your caregiver. These injections should be reserved for the most serious cases, because they may only be given a certain number of times.  HEAT AND COLD  Cold treatment (icing) relieves pain and reduces inflammation. Cold treatment should be applied for 10 to 15 minutes every 2 to 3 hours for inflammation and pain and immediately after any activity that aggravates your symptoms. Use ice packs or massage the area with a piece of ice (ice massage).  Heat treatment may be used prior to performing the stretching and strengthening activities prescribed by your caregiver, physical therapist, or athletic trainer. Use a heat pack or soak the injury in warm water.  SEEK IMMEDIATE MEDICAL CARE IF:  Treatment seems to offer no benefit, or  the condition worsens.  Any medications produce adverse side effects.  EXERCISES- RANGE OF MOTION (ROM) AND STRETCHING EXERCISES - Plantar Fasciitis  (Heel Spur Syndrome) These exercises may help you when beginning to rehabilitate your injury. Your symptoms may resolve with or without further involvement from your physician, physical therapist or athletic trainer. While completing these exercises, remember:   Restoring tissue flexibility helps normal motion to return to the joints. This allows healthier, less painful movement and activity.  An effective stretch should be held for at least 30 seconds.  A stretch should never be painful. You should only feel a gentle lengthening or release in the stretched tissue.  RANGE OF MOTION - Toe Extension, Flexion  Sit with your right / left leg crossed over your opposite knee.  Grasp your toes and gently pull them back toward the top of your foot. You should feel a stretch on the bottom of your toes and/or foot.  Hold this stretch for 10 seconds.  Now, gently pull your toes toward the bottom of your foot. You should feel a stretch on the top of your toes and or foot.  Hold this stretch for 10 seconds. Repeat  times. Complete this stretch 3 times per day.   RANGE OF MOTION - Ankle Dorsiflexion, Active Assisted  Remove shoes and sit on a chair that is preferably not on a carpeted surface.  Place right / left foot under knee. Extend your opposite leg for support.  Keeping your heel down, slide your right / left foot back toward the chair until you feel a stretch at your ankle or calf. If you do not feel a stretch, slide your bottom forward to the edge of the chair, while still keeping your heel down.  Hold this stretch for 10 seconds. Repeat 3 times. Complete this stretch 2 times per day.   STRETCH  Gastroc, Standing  Place hands on wall.  Extend right / left leg, keeping the front knee somewhat bent.  Slightly point your toes inward on your back foot.  Keeping your right / left heel on the floor and your knee straight, shift your weight toward the wall, not allowing your back to  arch.  You should feel a gentle stretch in the right / left calf. Hold this position for 10 seconds. Repeat 3 times. Complete this stretch 2 times per day.  STRETCH  Soleus, Standing  Place hands on wall.  Extend right / left leg, keeping the other knee somewhat bent.  Slightly point your toes inward on your back foot.  Keep your right / left heel on the floor, bend your back knee, and slightly shift your weight over the back leg so that you feel a gentle stretch deep in your back calf.  Hold this position for 10 seconds. Repeat 3 times. Complete this stretch 2 times per day.  STRETCH  Gastrocsoleus, Standing  Note: This exercise can place a lot of stress on your foot and ankle. Please complete this exercise only if specifically instructed by your caregiver.   Place the ball of your right / left foot on a step, keeping your other foot firmly on the same step.  Hold on to the wall or a rail for balance.  Slowly lift your other foot, allowing your body weight to press your heel down over the edge of the step.  You should feel a stretch in your right / left calf.  Hold this position for 10 seconds.  Repeat this exercise with a  slight bend in your right / left knee. Repeat 3 times. Complete this stretch 2 times per day.   STRENGTHENING EXERCISES - Plantar Fasciitis (Heel Spur Syndrome)  These exercises may help you when beginning to rehabilitate your injury. They may resolve your symptoms with or without further involvement from your physician, physical therapist or athletic trainer. While completing these exercises, remember:   Muscles can gain both the endurance and the strength needed for everyday activities through controlled exercises.  Complete these exercises as instructed by your physician, physical therapist or athletic trainer. Progress the resistance and repetitions only as guided.  STRENGTH - Towel Curls  Sit in a chair positioned on a non-carpeted surface.  Place  your foot on a towel, keeping your heel on the floor.  Pull the towel toward your heel by only curling your toes. Keep your heel on the floor. Repeat 3 times. Complete this exercise 2 times per day.  STRENGTH - Ankle Inversion  Secure one end of a rubber exercise band/tubing to a fixed object (table, pole). Loop the other end around your foot just before your toes.  Place your fists between your knees. This will focus your strengthening at your ankle.  Slowly, pull your big toe up and in, making sure the band/tubing is positioned to resist the entire motion.  Hold this position for 10 seconds.  Have your muscles resist the band/tubing as it slowly pulls your foot back to the starting position. Repeat 3 times. Complete this exercises 2 times per day.  Document Released: 11/05/2005 Document Revised: 01/28/2012 Document Reviewed: 02/17/2009 [facility] Patient Information 2014 [place], [place].

## 2019-04-15 ENCOUNTER — Ambulatory Visit: Payer: Medicaid Other | Admitting: Podiatry

## 2019-04-15 ENCOUNTER — Other Ambulatory Visit: Payer: Self-pay

## 2019-04-15 ENCOUNTER — Encounter: Payer: Self-pay | Admitting: Podiatry

## 2019-04-15 VITALS — Temp 97.3°F

## 2019-04-15 DIAGNOSIS — M722 Plantar fascial fibromatosis: Secondary | ICD-10-CM

## 2019-04-19 NOTE — Progress Notes (Signed)
   Subjective: 38 y.o. female presents the office today for follow up evaluation of plantar fasciitis of the left foot. She states the pain has resolved. She denies any other symptoms or modifying factors. Patient is here for further evaluation and treatment.   History reviewed. No pertinent past medical history.   Objective: Physical Exam General: The patient is alert and oriented x3 in no acute distress.  Dermatology: Skin is cool, dry and supple bilateral lower extremities. Negative for open lesions or macerations.  Vascular: Palpable pedal pulses bilaterally. No edema or erythema noted. Capillary refill within normal limits.  Neurological: Epicritic and protective threshold grossly intact bilaterally.   Musculoskeletal Exam: All pedal and ankle joints range of motion within normal limits bilateral. Muscle strength 5/5 in all groups bilateral.    Assessment: 1. Plantar fasciitis left foot - resolved   Plan of Care:  1. Patient evaluated.  2. Continue taking Meloxicam as needed.  3. Continue using plantar facial brace as needed.  4. Continue using OTC insoles.  5. Recommended good shoe gear.  6. Return to clinic as needed.    Felecia Shelling, DPM [facility]  Dr. Felecia Shelling, DPM    [address] [place], [place] [address]                Office [phone]  Fax [phone]

## 2019-08-05 ENCOUNTER — Other Ambulatory Visit: Payer: Self-pay

## 2019-08-05 ENCOUNTER — Ambulatory Visit: Payer: Medicaid Other | Attending: Orthopaedic Surgery | Admitting: Physical Therapy

## 2019-08-05 ENCOUNTER — Encounter: Payer: Self-pay | Admitting: Physical Therapy

## 2019-08-05 DIAGNOSIS — M79641 Pain in right hand: Secondary | ICD-10-CM | POA: Diagnosis present

## 2019-08-05 DIAGNOSIS — M6281 Muscle weakness (generalized): Secondary | ICD-10-CM | POA: Diagnosis present

## 2019-08-05 DIAGNOSIS — R208 Other disturbances of skin sensation: Secondary | ICD-10-CM | POA: Diagnosis present

## 2019-08-05 DIAGNOSIS — M79642 Pain in left hand: Secondary | ICD-10-CM | POA: Insufficient documentation

## 2019-08-05 DIAGNOSIS — M25521 Pain in right elbow: Secondary | ICD-10-CM | POA: Diagnosis present

## 2019-08-05 NOTE — Therapy (Signed)
[facility] [address] [place], [place], [address] Phone: [phone]   Fax:  [phone]  Physical Therapy Evaluation  Patient Details  Name: Jocelyn Townsend MRN: [id number] Date of Birth: [birth year] Referring Provider (PT): Ramond Marrowax Varkey MD   Encounter Date: 08/05/2019  PT End of Session - 08/05/19 0906    Visit Number  1    Number of Visits  13    Date for PT Re-Evaluation  09/30/19    Authorization Type  MCD: resubmit at 4th visit    PT Start Time  0800    PT Stop Time  0846    PT Time Calculation (min)  46 min    Activity Tolerance  Patient tolerated treatment well    Behavior During Therapy  [facility] for tasks assessed/performed       History reviewed. No pertinent past medical history.  Past Surgical History:  Procedure Laterality Date  . TUBAL LIGATION  2009    There were no vitals filed for this visit.   Subjective Assessment - 08/05/19 0806    Subjective  pt is a 38 y.o F with CC of elbow N/T in the elbow that started back in in 2014/2015 when she was sledding and landed hitting her elbow on a concrete barrier. Since then the N/T continues to fluctuates depending on positioning and relies on extending the arm to relieve tension. She reported some soreness int he shoulder that hasn't changed. She did go through OT for R carpal tunnle which she reported some point.    How long can you sit comfortably?  unlimited    How long can you stand comfortably?  unlimited    How long can you walk comfortably?  unlimited    Diagnostic tests  X-ray    Patient Stated Goals  reduce the N/T in the elbow/ hand, reduce soreness in    Currently in Pain?  Yes    Pain Score  0-No pain    Pain Orientation  Right    Pain Descriptors / Indicators  Tingling;Numbness    Pain Type  Chronic pain    Pain Radiating Towards  into the wrist/ hand chang    Pain Onset  More than a month ago    Pain Frequency  Intermittent    Aggravating Factors    leaning on the to elbow, having the elbow completely bent, staying still for a long period of time    Pain Relieving Factors  moving the arm around         [facility] - 08/05/19 0757      Assessment   Medical Diagnosis  R tennis elbow/ cubital tunnel    Referring Provider (PT)  Ramond Marrowax Varkey MD    Onset Date/Surgical Date  --   2014   Hand Dominance  Right    Next MD Visit  November    Prior Therapy  yes   had OT not PT      Precautions   Precautions  None      Restrictions   Weight Bearing Restrictions  No      Balance Screen   Has the patient fallen in the past 6 months  No    Has the patient had a decrease in activity level because of a fear of falling?   No    Is the patient reluctant to leave their home because of a fear of falling?   No      Home Environment  Living Environment  Private residence    Living Arrangements  Spouse/significant other;Children    Available Help at Discharge  Family    Type of Home  House      Prior Function   Level of Independence  Independent    Vocation  Full time employment   janitorial   Vocation Requirements  lifting/ pushing/ pulling, carrying    Leisure  being outside      Cognition   Overall Cognitive Status  Within Functional Limits for tasks assessed      Observation/Other Assessments   Quick DASH   15.91      Posture/Postural Control   Posture/Postural Control  Postural limitations    Postural Limitations  Rounded Shoulders;Forward head      ROM / Strength   AROM / PROM / Strength  AROM;PROM;Strength      AROM   Overall AROM   Within functional limits for tasks performed    AROM Assessment Site  Elbow;Forearm    Right/Left Elbow  Right;Left    Right/Left Forearm  Right;Left      Strength   Strength Assessment Site  Forearm;Elbow;Hand;Wrist    Right/Left Elbow  Right;Left    Right Elbow Flexion  4+/5    Right Elbow Extension  4+/5   reproduced pain in the elbow at the olecranon process   Left Elbow  Flexion  5/5    Left Elbow Extension  5/5    Right/Left Forearm  Right;Left    Right Forearm Pronation  4+/5   reproduced concordant symptoms into wrist/ hand   Right Forearm Supination  4+/5    Left Forearm Pronation  4+/5    Left Forearm Supination  4+/5    Right/Left Wrist  Right;Left    Right Wrist Flexion  4+/5    Right Wrist Extension  4+/5    Left Wrist Flexion  5/5    Left Wrist Extension  5/5    Left Wrist Radial Deviation  5/5    Right Hand Grip (lbs)  60   67,56,57   Left Hand Grip (lbs)  54   64,51,47     Palpation   Palpation comment  TTP at the olecranon process, and along the cubital tunnel, trigger points in the pronator teres / and wrist flexors      Special Tests   Other special tests  wrist/ 3rd digit resisted extension (neg), Tinels (+) along  the cubital tunnel, resisted pronation and wrist flexion (+) for concordant pain             Quick Dash - 08/05/19 0001    Open a tight or new jar  Moderate difficulty    Do heavy household chores (wash walls, wash floors)  Mild difficulty    Carry a shopping bag or briefcase  No difficulty    Wash your back  No difficulty    Use a knife to cut food  No difficulty    Recreational activities in which you take some force or impact through your arm, shoulder, or hand (golf, hammering, tennis)  Mild difficulty    During the past week, to what extent has your arm, shoulder or hand problem interfered with your normal social activities with family, friends, neighbors, or groups?  Not at all    During the past week, to what extent has your arm, shoulder or hand problem limited your work or other regular daily activities  Slightly    Arm, shoulder, or hand pain.  None  Tingling (pins and needles) in your arm, shoulder, or hand  Moderate    Difficulty Sleeping  No difficulty    DASH Score  15.91 %        Objective measurements completed on examination: See above findings.      [facility] Adult PT Treatment/Exercise -  08/05/19 0757      Exercises   Exercises  Wrist;Elbow;Hand      Hand Exercises   Other Hand Exercises  towel gripping 1 x 10 holding 5 seconds      Manual Therapy   Manual therapy comments  MTPR along the pronator teres and wrist flexors             PT Education - 08/05/19 0959    Education Details  evaluatoin findings, POC, goals, HEP with proper form/ rationale, anatomy of area involved.    Person(s) Educated  Patient    Methods  Explanation;Verbal cues;Demonstration;Handout    Comprehension  Verbalized understanding;Verbal cues required;Returned demonstration       PT Short Term Goals - 08/05/19 1008      PT SHORT TERM GOAL #1   Title  pt to be I with initial HEP    Baseline  no previous HEP    Time  3    Period  Weeks    Status  New    Target Date  08/26/19      PT SHORT TERM GOAL #2   Title  pt to verbalize and demonstrate proper sleeping positing and lifitng mechanics to prevent and reduce N/T    Baseline  reports increaed N/T while sleeping and pain at the olecranon with lifting    Time  3    Period  Weeks    Status  New    Target Date  08/26/19        PT Long Term Goals - 08/05/19 1009      PT LONG TERM GOAL #1   Title  increase R elbow/ forearm tightness to reduce N/T with reduced incidence lasting >/= 1 week for improvement in condition    Baseline  any static position or motion causes N/T    Time  6    Period  Weeks    Status  New    Target Date  09/30/19      PT LONG TERM GOAL #2   Title  pt to be able to lift/ lower >/=10# to and from and overhead shelf and push/pull >/= 20# with no report of N/T for functional strength and QOL    Baseline  limited lifting/ pushing/ pulling with N/T    Time  6    Period  Weeks    Status  New    Target Date  09/30/19      PT LONG TERM GOAL #3   Title  increase Quick dash by >/= 10 points to demo improvement in function    Baseline  15.91 initally    Time  6    Period  Weeks    Status  New    Target  Date  09/30/19      PT LONG TERM GOAL #4   Title  pt to be I with all HEP given as of last visit to maintain and progress current level of function    Baseline  no previous HEP    Time  6    Period  Weeks    Status  New    Target Date  09/30/19  Plan - 08/05/19 1004    Clinical Impression Statement  pt presents to OPPT with CC of R elbow soreness/ N/T in the wrist an hand starting in 2014 following landing on the her elbow when she was sledding, She has fucntional ROM bil with mild weakness int he R wrist/ elbow compared bil. Concordant symptoms procued with reisted pronation, wrist flexion, and noted TTP at the olecranon, and tightness in pronator teres. she would benefit from physical therapy to decreaes N/T in the wrist / hand, promote strengthening and endurnace and maxmize her function by addressing the deficits listed.    Stability/Clinical Decision Making  Stable/Uncomplicated    Clinical Decision Making  Low    Rehab Potential  Good    PT Frequency  2x / week    PT Duration  6 weeks   inital MCD auth 1 x a week for 3 weeks   PT Treatment/Interventions  ADLs/Self Care Home Management;Cryotherapy;Electrical Stimulation;Iontophoresis 4mg /ml Dexamethasone;Moist Heat;Ultrasound;Therapeutic exercise;Therapeutic activities;Taping;Manual techniques;Dry needling;Neuromuscular re-education;Balance training;Patient/family education    PT Next Visit Plan  review/ update HEP, discuss DN, STW, give nerve glides, wrist/ elbow strengthening/ endurance    PT Home Exercise Plan  wrist flexor stretch, gripping, supination/ pronation strengthening    Consulted and Agree with Plan of Care  Patient       Patient will benefit from skilled therapeutic intervention in order to improve the following deficits and impairments:  Decreased strength, Decreased activity tolerance, Decreased endurance, Postural dysfunction, Improper body mechanics, Pain  Visit Diagnosis: Pain in right  elbow  Muscle weakness (generalized)     Problem List Patient Active Problem List   Diagnosis Date Noted  . Well woman exam 10/20/2015  . 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 [place], [place], [address] Phone: [phone]   Fax:  [phone]  Name: Jocelyn Townsend MRN: [id number] Date of Birth: [birth year]

## 2019-08-19 ENCOUNTER — Encounter: Payer: Self-pay | Admitting: Physical Therapy

## 2019-08-19 ENCOUNTER — Ambulatory Visit: Payer: Medicaid Other | Admitting: Physical Therapy

## 2019-08-19 ENCOUNTER — Other Ambulatory Visit: Payer: Self-pay

## 2019-08-19 DIAGNOSIS — M79642 Pain in left hand: Secondary | ICD-10-CM

## 2019-08-19 DIAGNOSIS — M25521 Pain in right elbow: Secondary | ICD-10-CM

## 2019-08-19 DIAGNOSIS — M6281 Muscle weakness (generalized): Secondary | ICD-10-CM

## 2019-08-19 DIAGNOSIS — R208 Other disturbances of skin sensation: Secondary | ICD-10-CM

## 2019-08-19 DIAGNOSIS — M79641 Pain in right hand: Secondary | ICD-10-CM

## 2019-08-19 NOTE — Therapy (Signed)
[facility] [address] [place], [place], [address] Phone: [phone]   Fax:  [phone]  Physical Therapy Treatment  Patient Details  Name: Jocelyn Townsend MRN: [id number] Date of Birth: Jan 25, 1981 Referring Provider (PT): Ramond Marrowax Varkey MD   Encounter Date: 08/19/2019  PT End of Session - 08/19/19 0939    Visit Number  2    Number of Visits  13    Date for PT Re-Evaluation  09/30/19    Authorization Type  Medicaid    Authorization Time Period  08/18/19-09/07/19    Authorization - Visit Number  1    Authorization - Number of Visits  3    PT Start Time  0935    PT Stop Time  1025    PT Time Calculation (min)  50 min       History reviewed. No pertinent past medical history.  Past Surgical History:  Procedure Laterality Date  . TUBAL LIGATION  2009    There were no vitals filed for this visit.  Subjective Assessment - 08/19/19 0937    Subjective  No pain. Doing good right now.    Currently in Pain?  No/denies                       [facility]/Exercise - 08/19/19 0001      Exercises   Exercises  Other Exercises    Other Exercises   Bicep stretch in doorway 20 sec x 2 , increased N/T       Elbow Exercises   Elbow Flexion  20 reps   seated   Theraband Level (Elbow Flexion)  Level 2 (Red)    Elbow Extension  20 reps   standing    Theraband Level (Elbow Extension)  Level 2 (Red)    Forearm Supination  15 reps    Bar Weights/Barbell (Forearm Supination)  1 lb   on wooden handle   Forearm Pronation  15 reps    Bar Weights/Barbell (Forearm Pronation)  1 lb   on wooden handle   Other elbow exercises  UBE level 2 forward, Level 1 retro and cues to AAROM with left as needed     Other elbow exercises  Velcro pad handle turns 5 round trips , thenkey turns 5 round trips       Wrist Exercises   Wrist Flexion  20 reps    Theraband Level (Wrist Flexion)  Level 2 (Red)    Bar Weights/Barbell  (Wrist Flexion)  1 lb    Wrist Extension  20 reps    Theraband Level (Wrist Extension)  Level 2 (Red)    Bar Weights/Barbell (Wrist Extension)  1 lb    Other wrist exercises  Velcro pad 5 round trips Flex/ext       Modalities   Modalities  Cryotherapy      Cryotherapy   Number Minutes Cryotherapy  10 Minutes    Cryotherapy Location  Other (comment)   elbow   Type of Cryotherapy  Ice pack             PT Education - 08/19/19 1020    Education Details  HEP    Person(s) Educated  Patient    Methods  Explanation;Handout    Comprehension  Verbalized understanding       PT Short Term Goals - 08/05/19 1008      PT SHORT TERM GOAL #1   Title  pt to be I with initial  HEP    Baseline  no previous HEP    Time  3    Period  Weeks    Status  New    Target Date  08/26/19      PT SHORT TERM GOAL #2   Title  pt to verbalize and demonstrate proper sleeping positing and lifitng mechanics to prevent and reduce N/T    Baseline  reports increaed N/T while sleeping and pain at the olecranon with lifting    Time  3    Period  Weeks    Status  New    Target Date  08/26/19        PT Long Term Goals - 08/05/19 1009      PT LONG TERM GOAL #1   Title  increase R elbow/ forearm tightness to reduce N/T with reduced incidence lasting >/= 1 week for improvement in condition    Baseline  any static position or motion causes N/T    Time  6    Period  Weeks    Status  New    Target Date  09/30/19      PT LONG TERM GOAL #2   Title  pt to be able to lift/ lower >/=10# to and from and overhead shelf and push/pull >/= 20# with no report of N/T for functional strength and QOL    Baseline  limited lifting/ pushing/ pulling with N/T    Time  6    Period  Weeks    Status  New    Target Date  09/30/19      PT LONG TERM GOAL #3   Title  increase Quick dash by >/= 10 points to demo improvement in function    Baseline  15.91 initally    Time  6    Period  Weeks    Status  New    Target  Date  09/30/19      PT LONG TERM GOAL #4   Title  pt to be I with all HEP given as of last visit to maintain and progress current level of function    Baseline  no previous HEP    Time  6    Period  Weeks    Status  New    Target Date  09/30/19            Plan - 08/19/19 7628    Clinical Impression Statement  Pt reports compliance with HEP most of the time. She reports she is about the same. Reviewed HEP and progressed wit strengthening of wrist and elbow. Updated HEP. Elbow was warm at end of session. Trial of ice pack today and recommedned she try ice at home 2x per day.    PT Next Visit Plan  review/ update HEP, discuss DN, STW, give nerve glides, wrist/ elbow strengthening/ endurance    PT Home Exercise Plan  wrist flexor stretch, gripping, supination/ pronation strengthening, red band wrist flex and ext, red band bicep and tricep       Patient will benefit from skilled therapeutic intervention in order to improve the following deficits and impairments:  Decreased strength, Decreased activity tolerance, Decreased endurance, Postural dysfunction, Improper body mechanics, Pain  Visit Diagnosis: Pain in right elbow  Muscle weakness (generalized)  Pain in right hand  Pain in left hand  Other disturbances of skin sensation     Problem List Patient Active Problem List   Diagnosis Date Noted  . Well woman exam 10/20/2015  . CIN  III with severe dysplasia 08/19/2014    Dorene Ar, PTA 08/19/2019, 10:28 AM  [facility] [address] [place], [place], [address] Phone: [phone]   Fax:  [phone]  Name: Jocelyn Townsend MRN: [id number] Date of Birth: [birth year]

## 2019-08-26 ENCOUNTER — Other Ambulatory Visit: Payer: Self-pay

## 2019-08-26 ENCOUNTER — Ambulatory Visit: Payer: Medicaid Other | Attending: Orthopaedic Surgery | Admitting: Physical Therapy

## 2019-08-26 DIAGNOSIS — M25521 Pain in right elbow: Secondary | ICD-10-CM

## 2019-08-26 DIAGNOSIS — M79642 Pain in left hand: Secondary | ICD-10-CM

## 2019-08-26 DIAGNOSIS — R209 Unspecified disturbances of skin sensation: Secondary | ICD-10-CM | POA: Insufficient documentation

## 2019-08-26 DIAGNOSIS — M6281 Muscle weakness (generalized): Secondary | ICD-10-CM

## 2019-08-26 DIAGNOSIS — M79641 Pain in right hand: Secondary | ICD-10-CM

## 2019-08-26 DIAGNOSIS — R208 Other disturbances of skin sensation: Secondary | ICD-10-CM

## 2019-08-26 NOTE — Therapy (Signed)
[facility] [address] [place], [place], [address] Phone: [phone]   Fax:  [phone]  Physical Therapy Treatment  Patient Details  Name: Jocelyn Townsend MRN: [id number] Date of Birth: 1981/01/18 Referring Provider (PT): Ramond Marrow MD   Encounter Date: 08/26/2019  PT End of Session - 08/26/19 0854    Visit Number  3    Number of Visits  13    Date for PT Re-Evaluation  09/30/19    Authorization Type  Medicaid    Authorization Time Period  08/18/19-09/07/19    Authorization - Visit Number  2    Authorization - Number of Visits  3    PT Start Time  0853   pt arrived 8 min late   PT Stop Time  0932    PT Time Calculation (min)  39 min    Activity Tolerance  Patient tolerated treatment well    Behavior During Therapy  [facility] for tasks assessed/performed       No past medical history on file.  Past Surgical History:  Procedure Laterality Date  . TUBAL LIGATION  2009    There were no vitals filed for this visit.  Subjective Assessment - 08/26/19 0857    Subjective  " I am doing better, still no pain some reduction in N/T    Patient Stated Goals  reduce the N/T in the elbow/ hand, reduce soreness in    Currently in Pain?  No/denies    Pain Onset  More than a month ago    Pain Frequency  Intermittent         [facility] PT Assessment - 08/26/19 0001      Assessment   Medical Diagnosis  R tennis elbow/ cubital tunnel                   [facility] Adult PT Treatment/Exercise - 08/26/19 0001      Elbow Exercises   Elbow Flexion  Strengthening;Both;Bar weights/barbell    Bar Weights/Barbell (Elbow Flexion)  5 lbs    Forearm Supination  15 reps;Bar weights/barbell    Bar Weights/Barbell (Forearm Supination)  4 lbs    Forearm Pronation  15 reps;Bar weights/barbell    Bar Weights/Barbell (Forearm Pronation)  4 lbs    Other elbow exercises  UBE L4 x 4 min    changing direction at 2 min     Hand Exercises   Other  Hand Exercises  green digiflex tool 1 x 10 with slow eccentric release x 3 sec ea.      Wrist Exercises   Wrist Flexion  Strengthening;Right;20 reps;Bar weights/barbell;Supine   focus on eccentric loading   Bar Weights/Barbell (Wrist Flexion)  4 lbs    Wrist Extension  20 reps;Supine    Bar Weights/Barbell (Wrist Extension)  4 lbs    Other wrist exercises  --      Manual Therapy   Manual Therapy  Joint mobilization;Soft tissue mobilization;Taping    Manual therapy comments  skilled palpation and monitoring of pt throughout TPDN    Soft tissue mobilization  IASTM along the pronator teres and wrist flexors    Kinesiotex  Inhibit Muscle      Kinesiotix   Inhibit Muscle   R pronator teres       Trigger Point Dry Needling - 08/26/19 0001    Consent Given?  Yes    Education Handout Provided  Yes    Muscles Treated Wrist/Hand  Pronator teres    Pronator teres  Response  Twitch response elicited;Palpable increased muscle length   R          PT Education - 08/26/19 0918    Education Details  muscle anatomy and referral patterns. What TPDN is, what to expect and benefits.    Methods  Explanation    Comprehension  Verbalized understanding       PT Short Term Goals - 08/05/19 1008      PT SHORT TERM GOAL #1   Title  pt to be I with initial HEP    Baseline  no previous HEP    Time  3    Period  Weeks    Status  New    Target Date  08/26/19      PT SHORT TERM GOAL #2   Title  pt to verbalize and demonstrate proper sleeping positing and lifitng mechanics to prevent and reduce N/T    Baseline  reports increaed N/T while sleeping and pain at the olecranon with lifting    Time  3    Period  Weeks    Status  New    Target Date  08/26/19        PT Long Term Goals - 08/05/19 1009      PT LONG TERM GOAL #1   Title  increase R elbow/ forearm tightness to reduce N/T with reduced incidence lasting >/= 1 week for improvement in condition    Baseline  any static position or  motion causes N/T    Time  6    Period  Weeks    Status  New    Target Date  09/30/19      PT LONG TERM GOAL #2   Title  pt to be able to lift/ lower >/=10# to and from and overhead shelf and push/pull >/= 20# with no report of N/T for functional strength and QOL    Baseline  limited lifting/ pushing/ pulling with N/T    Time  6    Period  Weeks    Status  New    Target Date  09/30/19      PT LONG TERM GOAL #3   Title  increase Quick dash by >/= 10 points to demo improvement in function    Baseline  15.91 initally    Time  6    Period  Weeks    Status  New    Target Date  09/30/19      PT LONG TERM GOAL #4   Title  pt to be I with all HEP given as of last visit to maintain and progress current level of function    Baseline  no previous HEP    Time  6    Period  Weeks    Status  New    Target Date  09/30/19            Plan - 08/26/19 4235    Clinical Impression Statement  pt reports improvement in N/T stating she was able to sleep all night without symptoms. Educated and consent was given for TPDN focusing on the R pronator teres followed with IASTM techniques. continued working on wrist/ elbow strengthening with focus on eccentrics. Applied KT tape to decrease pronator activation and promote relief.    PT Treatment/Interventions  ADLs/Self Care Home Management;Cryotherapy;Electrical Stimulation;Iontophoresis 4mg /ml Dexamethasone;Moist Heat;Ultrasound;Therapeutic exercise;Therapeutic activities;Taping;Manual techniques;Dry needling;Neuromuscular re-education;Balance training;Patient/family education    PT Next Visit Plan  ERO/ MCD resubmission update HEP, discuss DN, STW, give nerve glides, wrist/ elbow  strengthening/ endurance, response to DN and KT tape    PT Home Exercise Plan  wrist flexor stretch, gripping, supination/ pronation strengthening, red band wrist flex and ext, red band bicep and tricep    Consulted and Agree with Plan of Care  Patient       Patient will  benefit from skilled therapeutic intervention in order to improve the following deficits and impairments:  Decreased strength, Decreased activity tolerance, Decreased endurance, Postural dysfunction, Improper body mechanics, Pain  Visit Diagnosis: Pain in right elbow  Muscle weakness (generalized)  Pain in right hand  Pain in left hand  Other disturbances of skin sensation     Problem List Patient Active Problem List   Diagnosis Date Noted  . Well woman exam 10/20/2015  . 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 [place], [place], [address] Phone: [phone]   Fax:  [phone]  Name: Jocelyn Townsend MRN: [id number] Date of Birth: [birth year]

## 2019-09-02 ENCOUNTER — Other Ambulatory Visit: Payer: Self-pay

## 2019-09-02 ENCOUNTER — Ambulatory Visit: Payer: Medicaid Other | Admitting: Physical Therapy

## 2019-09-02 ENCOUNTER — Encounter: Payer: Self-pay | Admitting: Physical Therapy

## 2019-09-02 DIAGNOSIS — R208 Other disturbances of skin sensation: Secondary | ICD-10-CM

## 2019-09-02 DIAGNOSIS — M79642 Pain in left hand: Secondary | ICD-10-CM

## 2019-09-02 DIAGNOSIS — M25521 Pain in right elbow: Secondary | ICD-10-CM

## 2019-09-02 DIAGNOSIS — M79641 Pain in right hand: Secondary | ICD-10-CM

## 2019-09-02 DIAGNOSIS — M6281 Muscle weakness (generalized): Secondary | ICD-10-CM

## 2019-09-02 NOTE — Therapy (Signed)
[place], [place], [address] Phone: [phone]   Fax:  [phone]  Physical Therapy Treatment  Patient Details  Name: Jocelyn Townsend MRN: [id number] Date of Birth: [birth year] Referring Provider (PT): Ophelia Charter MD   Encounter Date: 09/02/2019  PT End of Session - 09/02/19 0856    Visit Number  4    Number of Visits  13    Date for PT Re-Evaluation  09/30/19    Authorization Type  Medicaid: Resubmitted on 09/02/2019    Authorization Time Period  08/18/19-09/07/19    Authorization - Visit Number  3    Authorization - Number of Visits  3    PT Start Time  3009    PT Stop Time  0929    PT Time Calculation (min)  42 min    Activity Tolerance  Patient tolerated treatment well    Behavior During Therapy  [facility] for tasks assessed/performed       History reviewed. No pertinent past medical history.  Past Surgical History:  Procedure Laterality Date  . TUBAL LIGATION  2009    There were no vitals filed for this visit.  Subjective Assessment - 09/02/19 0853    Subjective  "The DN helped with the N/T, I still get some N/T in the but mostly at night"    Patient Stated Goals  reduce the N/T in the elbow/ hand, reduce soreness in    Currently in Pain?  No/denies    Pain Type  Chronic pain    Pain Onset  More than a month ago    Pain Frequency  Intermittent    Aggravating Factors   laying on the side, gripping         [facility] PT Assessment - 09/02/19 0001      Assessment   Medical Diagnosis  R tennis elbow/ cubital tunnel    Referring Provider (PT)  Ophelia Charter MD      Observation/Other Assessments   Quick DASH   27.27           Katina Dung - 09/02/19 0001    Open a tight or new jar  Severe difficulty    Do heavy household chores (wash walls, wash floors)  No difficulty    Carry a shopping bag or briefcase  Mild difficulty    Wash your back  No difficulty    Use a knife to cut food  Mild  difficulty    Recreational activities in which you take some force or impact through your arm, shoulder, or hand (golf, hammering, tennis)  Mild difficulty    During the past week, to what extent has your arm, shoulder or hand problem interfered with your normal social activities with family, friends, neighbors, or groups?  Not at all    During the past week, to what extent has your arm, shoulder or hand problem limited your work or other regular daily activities  Slightly    Arm, shoulder, or hand pain.  Mild    Tingling (pins and needles) in your arm, shoulder, or hand  Moderate    Difficulty Sleeping  Moderate difficulty    DASH Score  27.27 %             [facility] Adult PT Treatment/Exercise - 09/02/19 0912      Elbow Exercises   Other elbow exercises  wrist flexor stretch 2 x 30 sec   cues for proper form.      Shoulder Exercises:  Stretch   Other Shoulder Stretches  pec stretch 2 x 30 sec      Manual Therapy   Manual therapy comments  skilled palpation and monitoring of pt throughout TPDN    Soft tissue mobilization  IASTM along the pronator teres and wrist flexors    Kinesiotex  Inhibit Muscle      Kinesiotix   Inhibit Muscle   R pronator teres       Trigger Point Dry Needling - 09/02/19 0001    Consent Given?  Yes    Education Handout Provided  Yes    Muscles Treated Wrist/Hand  Flexor carpi ulnaris;Flexor carpi radialis;Pronator teres    Electrical Stimulation Performed with Dry Needling  Yes    E-stim with Dry Needling Details  frequency at 18, x 8 min to tolerance pre-mod set-up    Flexor carpi radialis Response  Twitch response elicited;Palpable increased muscle length   R only   Flexor carpi ulnaris Response  Twitch response elicited;Palpable increased muscle length   R only   Pronator teres Response  Twitch response elicited;Palpable increased muscle length   R only          PT Education - 09/02/19 0918    Education Details  reviewed previously providd  HEP and updated today.    Person(s) Educated  Patient    Methods  Explanation;Verbal cues;Handout    Comprehension  Verbalized understanding;Verbal cues required       PT Short Term Goals - 09/02/19 0856      PT SHORT TERM GOAL #1   Title  pt to be I with initial HEP    Period  Weeks    Status  Achieved      PT SHORT TERM GOAL #2   Title  pt to verbalize and demonstrate proper sleeping positing and lifitng mechanics to prevent and reduce N/T    Period  Weeks    Status  Achieved        PT Long Term Goals - 09/02/19 0857      PT LONG TERM GOAL #1   Title  increase R elbow/ forearm tightness to reduce N/T with reduced incidence lasting >/= 1 week for improvement in condition    Baseline  still gets N/T at night and with gripping    Time  6    Period  Weeks    Status  On-going      PT LONG TERM GOAL #2   Title  pt to be able to lift/ lower >/=10# to and from and overhead shelf and push/pull >/= 20# with no report of N/T for functional strength and QOL    Time  6    Period  Weeks    Status  Achieved      PT LONG TERM GOAL #3   Title  increase Quick dash by >/= 10 points to demo improvement in function    Baseline  27.27    Period  Weeks    Status  Achieved    Target Date  09/30/19      PT LONG TERM GOAL #4   Title  pt to be I with all HEP given as of last visit to maintain and progress current level of function    Baseline  independent with current HEP and progressing as able.    Time  6    Period  Weeks    Status  On-going            Plan - 09/02/19  1201    Clinical Impression Statement  pt reports improvement in N/T but conitnues to have reproduction of sypmtoms with sidelying. She met all STG's today and is making progress toward her LTG's and is motivated to continued with treatment and imrpove. Continued TPDN focusing on wrist flexors and pronator teres combined with E-stim. continued stretch of the wrist flexors and discussed sleeping positiong. She would  benefit from physical therpay to decreae muscle tension, reduce N/T, promote proper posture and lifting mechanics and work toward remaining goals and independent exercise.    PT Treatment/Interventions  ADLs/Self Care Home Management;Cryotherapy;Electrical Stimulation;Iontophoresis 37m/ml Dexamethasone;Moist Heat;Ultrasound;Therapeutic exercise;Therapeutic activities;Taping;Manual techniques;Dry needling;Neuromuscular re-education;Balance training;Patient/family education    PT Next Visit Plan  update HEP, discuss DN, STW, give nerve glides, wrist/ elbow strengthening/ endurance, response to DN and KT tape    PT Home Exercise Plan  wrist flexor stretch, gripping, supination/ pronation strengthening, red band wrist flex and ext, red band bicep and tricep    Consulted and Agree with Plan of Care  Patient       Patient will benefit from skilled therapeutic intervention in order to improve the following deficits and impairments:  Decreased strength, Decreased activity tolerance, Decreased endurance, Postural dysfunction, Improper body mechanics, Pain  Visit Diagnosis: Pain in right elbow  Muscle weakness (generalized)  Pain in right hand  Pain in left hand  Other disturbances of skin sensation     Problem List Patient Active Problem List   Diagnosis Date Noted  . Well woman exam 10/20/2015  . 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

## 2019-09-11 ENCOUNTER — Encounter

## 2019-09-17 ENCOUNTER — Ambulatory Visit: Payer: Medicaid Other | Admitting: Physical Therapy

## 2019-09-17 ENCOUNTER — Other Ambulatory Visit: Payer: Self-pay

## 2019-09-17 ENCOUNTER — Encounter: Payer: Self-pay | Admitting: Physical Therapy

## 2019-09-17 DIAGNOSIS — M79642 Pain in left hand: Secondary | ICD-10-CM

## 2019-09-17 DIAGNOSIS — M25521 Pain in right elbow: Secondary | ICD-10-CM | POA: Diagnosis not present

## 2019-09-17 DIAGNOSIS — M6281 Muscle weakness (generalized): Secondary | ICD-10-CM

## 2019-09-17 DIAGNOSIS — M79641 Pain in right hand: Secondary | ICD-10-CM

## 2019-09-17 DIAGNOSIS — R208 Other disturbances of skin sensation: Secondary | ICD-10-CM

## 2019-09-17 NOTE — Therapy (Signed)
[facility] [address] [place], [place], [address] Phone: [phone]   Fax:  [phone]  Physical Therapy Treatment  Patient Details  Name: Aairah Negrette MRN: [id number] Date of Birth: Sep 03, 1981 Referring Provider (PT): Ramond Marrow MD   Encounter Date: 09/17/2019  PT End of Session - 09/17/19 0848    Visit Number  5    Number of Visits  13    Date for PT Re-Evaluation  09/30/19    Authorization Type  Medicaid: Resubmitted on 09/02/2019    Authorization Time Period  08/18/19-09/07/19, 6 visits 09/16/2019 - 10/27/2019    Authorization - Visit Number  1    Authorization - Number of Visits  6    PT Start Time  0848    PT Stop Time  0929    PT Time Calculation (min)  41 min    Activity Tolerance  Patient tolerated treatment well    Behavior During Therapy  [facility] for tasks assessed/performed       History reviewed. No pertinent past medical history.  Past Surgical History:  Procedure Laterality Date  . TUBAL LIGATION  2009    There were no vitals filed for this visit.  Subjective Assessment - 09/17/19 0849    Subjective  "Everything is going okay. I did have a little set-back I was moving a heavy tote with books in it and it really bothered my elbow"    Patient Stated Goals  reduce the N/T in the elbow/ hand, reduce soreness in    Currently in Pain?  Yes    Pain Score  0-No pain   at max 1/10        [facility] PT Assessment - 09/17/19 0001      Assessment   Medical Diagnosis  R tennis elbow/ cubital tunnel    Referring Provider (PT)  Ramond Marrow MD                   [facility] Adult PT Treatment/Exercise - 09/17/19 0001      Shoulder Exercises: Seated   Other Seated Exercises  bicep curl 2 x 10  with 5#, reverse bicep curl 1 x 10 3# for brachialis   focus on eccentric lowering     Shoulder Exercises: Standing   External Rotation  Strengthening;10 reps;Theraband    Theraband Level (Shoulder External  Rotation)  Level 3 (Green)    Internal Rotation  Strengthening;Right;15 reps;Theraband    Theraband Level (Shoulder Internal Rotation)  Level 3 (Green)      Shoulder Exercises: ROM/Strengthening   UBE (Upper Arm Bike)  L3 x 4 min   forward x 2 min /backward x 2 min     Shoulder Exercises: Stretch   Other Shoulder Stretches  pec stretch 2 x 30 sec      Manual Therapy   Manual therapy comments  skilled palpation and monitoring of pt throughout TPDN    Soft tissue mobilization  IASTM along the pronator teres and wrist flexors    Kinesiotex  Inhibit Muscle      Kinesiotix   Inhibit Muscle   R pronator teres       Trigger Point Dry Needling - 09/17/19 0001    Consent Given?  Yes    Education Handout Provided  Yes    Muscles Treated Upper Quadrant  Biceps    Electrical Stimulation Performed with Dry Needling  Yes    E-stim with Dry Needling Details  frequency at 18, x 8 min to  tolerance pre-mod set-up    Biceps Response  Twitch response elicited;Palpable increased muscle length    Flexor carpi ulnaris Response  Twitch response elicited;Palpable increased muscle length    Pronator teres Response  Twitch response elicited;Palpable increased muscle length           PT Education - 09/17/19 0924    Education Details  updated HEP today for bicep strengthening    Person(s) Educated  Patient    Methods  Explanation;Verbal cues;Handout    Comprehension  Verbalized understanding;Verbal cues required       PT Short Term Goals - 09/02/19 0856      PT SHORT TERM GOAL #1   Title  pt to be I with initial HEP    Period  Weeks    Status  Achieved      PT SHORT TERM GOAL #2   Title  pt to verbalize and demonstrate proper sleeping positing and lifitng mechanics to prevent and reduce N/T    Period  Weeks    Status  Achieved        PT Long Term Goals - 09/02/19 0857      PT LONG TERM GOAL #1   Title  increase R elbow/ forearm tightness to reduce N/T with reduced incidence lasting  >/= 1 week for improvement in condition    Baseline  still gets N/T at night and with gripping    Time  6    Period  Weeks    Status  On-going      PT LONG TERM GOAL #2   Title  pt to be able to lift/ lower >/=10# to and from and overhead shelf and push/pull >/= 20# with no report of N/T for functional strength and QOL    Time  6    Period  Weeks    Status  Achieved      PT LONG TERM GOAL #3   Title  increase Quick dash by >/= 10 points to demo improvement in function    Baseline  27.27    Period  Weeks    Status  Achieved    Target Date  09/30/19      PT LONG TERM GOAL #4   Title  pt to be I with all HEP given as of last visit to maintain and progress current level of function    Baseline  independent with current HEP and progressing as able.    Time  6    Period  Weeks    Status  On-going            Plan - 09/17/19 0930    Clinical Impression Statement  pt reports improvement with the N/T in the hand but had increased soreness inthe elbow when attempting to lift a tote wit hincreased weight which aggrivated the elbow. Cotninued  TPDN utilizing E-stim to promote twitch/ fatigue. continued IASTM techniques and shoulder/ strengthening. pt reproted relief of pain and tightness end of session.    PT Treatment/Interventions  ADLs/Self Care Home Management;Cryotherapy;Electrical Stimulation;Iontophoresis 4mg /ml Dexamethasone;Moist Heat;Ultrasound;Therapeutic exercise;Therapeutic activities;Taping;Manual techniques;Dry needling;Neuromuscular re-education;Balance training;Patient/family education    PT Next Visit Plan  update HEP, discuss DN, STW, give nerve glides, wrist/ elbow strengthening/ endurance, response to DN and KT tape, update HEP for shoulder strengthening.    PT Home Exercise Plan  wrist flexor stretch, gripping, supination/ pronation strengthening, red band wrist flex and ext, red band bicep and tricep, bicep brachii, brachialis curls    Consulted and Agree with Plan  of  Care  Patient       Patient will benefit from skilled therapeutic intervention in order to improve the following deficits and impairments:  Decreased strength, Decreased activity tolerance, Decreased endurance, Postural dysfunction, Improper body mechanics, Pain  Visit Diagnosis: Pain in right elbow  Muscle weakness (generalized)  Pain in right hand  Pain in left hand  Other disturbances of skin sensation     Problem List Patient Active Problem List   Diagnosis Date Noted  . Well woman exam 10/20/2015  . 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

## 2019-09-24 ENCOUNTER — Ambulatory Visit: Payer: Medicaid Other | Admitting: Physical Therapy

## 2019-10-01 ENCOUNTER — Encounter: Payer: Self-pay | Admitting: Physical Therapy

## 2019-10-01 ENCOUNTER — Ambulatory Visit: Payer: Medicaid Other | Attending: Orthopaedic Surgery | Admitting: Physical Therapy

## 2019-10-01 ENCOUNTER — Other Ambulatory Visit: Payer: Self-pay

## 2019-10-01 DIAGNOSIS — M25521 Pain in right elbow: Secondary | ICD-10-CM

## 2019-10-01 DIAGNOSIS — M79641 Pain in right hand: Secondary | ICD-10-CM | POA: Insufficient documentation

## 2019-10-01 DIAGNOSIS — M6281 Muscle weakness (generalized): Secondary | ICD-10-CM | POA: Insufficient documentation

## 2019-10-01 NOTE — Therapy (Signed)
Jocelyn Townsend, [place], [address] Phone: [phone]   Fax:  [phone]  Physical Therapy Treatment / Discharge  Patient Details  Name: Jocelyn Townsend MRN: [id number] Date of Birth: May 17, 1981 Referring Provider (PT): Ophelia Charter MD   Encounter Date: 10/01/2019  PT End of Session - 10/01/19 0951    Visit Number  6    Number of Visits  13    Date for PT Re-Evaluation  09/30/19    Authorization Type  Medicaid: Resubmitted on 09/02/2019    Authorization Time Period  08/18/19-09/07/19, 6 visits 09/16/2019 - 10/27/2019    Authorization - Visit Number  2    Authorization - Number of Visits  6    PT Start Time  0940   pt arrived 10 min late   PT Stop Time  1005    PT Time Calculation (min)  25 min    Activity Tolerance  Patient tolerated treatment well    Behavior During Therapy  [facility] for tasks assessed/performed       History reviewed. No pertinent past medical history.  Past Surgical History:  Procedure Laterality Date  . TUBAL LIGATION  2009    There were no vitals filed for this visit.  Subjective Assessment - 10/01/19 0942    Subjective  " I have been doing pretty good, I did hit my elbow pretty hard and it increased pain. My grip is much better"    Patient Stated Goals  reduce the N/T in the elbow/ hand, reduce soreness in    Currently in Pain?  Yes         [facility] PT Assessment - 10/01/19 0001      Assessment   Medical Diagnosis  R tennis elbow/ cubital tunnel    Referring Provider (PT)  Ophelia Charter MD                   [facility] Adult PT Treatment/Exercise - 10/01/19 0001      Modalities   Modalities  Moist Heat      Moist Heat Therapy   Number Minutes Moist Heat  6 Minutes    Moist Heat Location  Elbow   while reviewing HEP             PT Education - 10/01/19 1007    Education Details  reviewed previously provided HEp and provided upgraded resistance to continue  strengthening. Anatomy of the elbow and effect of hitting olecranon process and potential bursitis and swelling in the area, being mindful of surroundings to prevent repetitive trauma to the elbow.    Person(s) Educated  Patient    Methods  Explanation;Verbal cues;Handout    Comprehension  Verbalized understanding;Verbal cues required       PT Short Term Goals - 09/02/19 0856      PT SHORT TERM GOAL #1   Title  pt to be I with initial HEP    Period  Weeks    Status  Achieved      PT SHORT TERM GOAL #2   Title  pt to verbalize and demonstrate proper sleeping positing and lifitng mechanics to prevent and reduce N/T    Period  Weeks    Status  Achieved        PT Long Term Goals - 10/01/19 3151      PT LONG TERM GOAL #1   Title  increase R elbow/ forearm tightness to reduce N/T with reduced incidence lasting >/= 1 week for  improvement in condition    Period  Weeks    Status  Achieved      PT LONG TERM GOAL #2   Title  pt to be able to lift/ lower >/=10# to and from and overhead shelf and push/pull >/= 20# with no report of N/T for functional strength and QOL    Period  Weeks    Status  Achieved      PT LONG TERM GOAL #3   Title  increase Quick dash by >/= 10 points to demo improvement in function    Period  Weeks    Status  Achieved      PT LONG TERM GOAL #4   Title  pt to be I with all HEP given as of last visit to maintain and progress current level of function    Period  Weeks            Plan - 10/01/19 1009    Clinical Impression Statement  pt presents to [place] noting some soreness in the elbow as a result of frequent hitting it when she is at work but other wise notes no issues in the wrist / hand and has no problems with sleeping. reviewed all HEP and how to appropriately progress. she met all goals today and is able to maintain and progress her current level of function independently.    PT Treatment/Interventions  ADLs/Self Care Home  Management;Cryotherapy;Electrical Stimulation;Iontophoresis 62m/ml Dexamethasone;Moist Heat;Ultrasound;Therapeutic exercise;Therapeutic activities;Taping;Manual techniques;Dry needling;Neuromuscular re-education;Balance training;Patient/family education    PT Next Visit Plan  D/C today    PT Home Exercise Plan  wrist flexor stretch, gripping, supination/ pronation strengthening, red band wrist flex and ext, red band bicep and tricep, bicep brachii, brachialis curls    Consulted and Agree with Plan of Care  Patient       Patient will benefit from skilled therapeutic intervention in order to improve the following deficits and impairments:  Decreased strength, Decreased activity tolerance, Decreased endurance, Postural dysfunction, Improper body mechanics, Pain  Visit Diagnosis: Pain in right elbow  Muscle weakness (generalized)  Pain in right hand     Problem List Patient Active Problem List   Diagnosis Date Noted  . Well woman exam 10/20/2015  . 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  Patient goals were met. Patient is being discharged due to meeting the stated rehab goals.  ?????         Kristoffer Leamon PT, DPT, LAT, ATC  10/01/19  10:12 AM

## 2019-10-07 ENCOUNTER — Ambulatory Visit: Payer: Medicaid Other | Admitting: Physical Therapy

## 2019-10-08 ENCOUNTER — Ambulatory Visit: Payer: Medicaid Other | Admitting: Physical Therapy

## 2020-01-05 ENCOUNTER — Ambulatory Visit: Payer: Medicaid Other | Attending: Internal Medicine

## 2020-01-05 DIAGNOSIS — Z20822 Contact with and (suspected) exposure to covid-19: Secondary | ICD-10-CM

## 2020-01-06 LAB — NOVEL CORONAVIRUS, NAA: SARS-CoV-2, NAA: NOT DETECTED

## 2020-04-14 ENCOUNTER — Encounter: Payer: Self-pay | Admitting: Neurology

## 2020-04-14 ENCOUNTER — Other Ambulatory Visit: Payer: Self-pay | Admitting: Neurology

## 2020-04-19 ENCOUNTER — Encounter: Payer: Self-pay | Admitting: Neurology

## 2020-04-19 ENCOUNTER — Ambulatory Visit: Payer: Medicaid Other | Admitting: Neurology

## 2020-04-19 ENCOUNTER — Other Ambulatory Visit: Payer: Self-pay

## 2020-04-19 VITALS — BP 133/90 | HR 61 | Ht 65.0 in | Wt 174.0 lb

## 2020-04-19 DIAGNOSIS — R0683 Snoring: Secondary | ICD-10-CM

## 2020-04-19 DIAGNOSIS — G4710 Hypersomnia, unspecified: Secondary | ICD-10-CM

## 2020-04-19 DIAGNOSIS — G4726 Circadian rhythm sleep disorder, shift work type: Secondary | ICD-10-CM | POA: Diagnosis not present

## 2020-04-19 MED ORDER — MELATONIN 2.5 MG PO CHEW: CHEWABLE_TABLET | ORAL | Status: DC

## 2020-04-19 NOTE — Patient Instructions (Signed)
Hypersomnia Hypersomnia is a condition in which a person feels very tired during the day even though he or she gets plenty of sleep at night. A person with this condition may take naps during the day and may find it very difficult to wake up from sleep. Hypersomnia may affect a person's ability to think, concentrate, drive, or remember things. What are the causes? The cause of this condition may not be known. Possible causes include:  Certain medicines.  Sleep disorders, such as narcolepsy and sleep apnea.  Injury to the head, brain, or spinal cord.  Drug or alcohol use.  Gastroesophageal reflux disease (GERD).  Tumors.  Certain medical conditions, such as depression, diabetes, or an underactive thyroid gland (hypothyroidism). What are the signs or symptoms? The main symptoms of hypersomnia include:  Feeling very tired throughout the day, regardless of how much sleep you got the night before.  Having trouble waking up. Others may find it difficult to wake you up when you are sleeping.  Sleeping for longer and longer periods at a time.  Taking naps throughout the day. Other symptoms may include:  Feeling restless, anxious, or annoyed.  Lacking energy.  Having trouble with: ? Remembering. ? Speaking. ? Thinking.  Loss of appetite.  Seeing, hearing, tasting, smelling, or feeling things that are not real (hallucinations). How is this diagnosed? This condition may be diagnosed based on:  Your symptoms and medical history.  Your sleeping habits. Your health care provider may ask you to write down your sleeping habits in a daily sleep log, along with any symptoms you have.  A series of tests that are done while you sleep (sleep study or polysomnogram).  A test that measures how quickly you can fall asleep during the day (daytime nap study or multiple sleep latency test). How is this treated? Treatment can help you manage your condition. Treatment may  include:  Following a regular sleep routine.  Lifestyle changes, such as changing your eating habits, getting regular exercise, and avoiding alcohol or caffeinated beverages.  Taking medicines to make you more alert (stimulants) during the day.  Treating any underlying medical causes of hypersomnia. Follow these instructions at home: Sleep routine   Schedule the same bedtime and wake-up time each day.  Practice a relaxing bedtime routine. This may include reading, meditation, deep breathing, or taking a warm bath before going to sleep.  Get regular exercise each day. Avoid strenuous exercise in the evening hours.  Keep your sleep environment at a cooler temperature, darkened, and quiet.  Sleep with pillows and a mattress that are comfortable and supportive.  Schedule short 20-minute naps for when you feel sleepiest during the day.  Talk with your employer or teachers about your hypersomnia. If possible, adjust your schedule so that: ? You have a regular daytime work schedule. ? You can take a scheduled nap during the day. ? You do not have to work or be active at night.  Do not eat a heavy meal for a few hours before bedtime. Eat your meals at about the same times every day.  Avoid drinking alcohol or caffeinated beverages. Safety   Do not drive or use heavy machinery if you are sleepy. Ask your health care provider if it is safe for you to drive.  Wear a life jacket when swimming or spending time near water. General instructions  Take supplements and over-the-counter and prescription medicines only as told by your health care provider.  Keep a sleep log that will help   your doctor manage your condition. This may include information about: ? What time you go to bed each night. ? How often you wake up at night. ? How many hours you sleep at night. ? How often and for how long you nap during the day. ? Any observations from others, such as leg movements during sleep,  sleep walking, or snoring.  Keep all follow-up visits as told by your health care provider. This is important. Contact a health care provider if:  You have new symptoms.  Your symptoms get worse. Get help right away if:  You have serious thoughts about hurting yourself or someone else. If you ever feel like you may hurt yourself or others, or have thoughts about taking your own life, get help right away. You can go to your nearest emergency department or call:  Your local emergency services (911 in the [place]  A suicide crisis helpline, such as the National Suicide Prevention Lifeline at (803)794-8032. This is open 24 hours a day. Summary  Hypersomnia refers to a condition in which you feel very tired during the day even though you get plenty of sleep at night.  A person with this condition may take naps during the day and may find it very difficult to wake up from sleep.  Hypersomnia may affect a person's ability to think, concentrate, drive, or remember things.  Treatment, such as following a regular sleep routine and making some lifestyle changes, can help you manage your condition. This information is not intended to replace advice given to you by your health care provider. Make sure you discuss any questions you have with your health care provider. Document Revised: 11/07/2017 Document Reviewed: 11/07/2017 Elsevier Patient Education  2020 [employer]vier Inc[employer] Quality Sleep Information, Adult Quality sleep is important for your mental and physical health. It also improves your quality of life. Quality sleep means you:  Are asleep for most of the time you are in bed.  Fall asleep within 30 minutes.  Wake up no more than once a night.  Are awake for no longer than 20 minutes if you do wake up during the night. Most adults need 7-8 hours of quality sleep each night. How can poor sleep affect me? If you do not get enough quality sleep, you may have:  Mood swings.  Daytime  sleepiness.  Confusion.  Decreased reaction time.  Sleep disorders, such as insomnia and sleep apnea.  Difficulty with: ? Solving problems. ? Coping with stress. ? Paying attention. These issues may affect your performance and productivity at work, school, and at home. Lack of sleep may also put you at higher risk for accidents, suicide, and risky behaviors. If you do not get quality sleep you may also be at higher risk for several health problems, including:  Infections.  Type 2 diabetes.  Heart disease.  High blood pressure.  Obesity.  Worsening of long-term conditions, like arthritis, kidney disease, depression, Parkinson's disease, and epilepsy. What actions can I take to get more quality sleep?      Stick to a sleep schedule. Go to sleep and wake up at about the same time each day. Do not try to sleep less on weekdays and make up for lost sleep on weekends. This does not work.  Try to get about 30 minutes of exercise on most days. Do not exercise 2-3 hours before going to bed.  Limit naps during the day to 30 minutes or less.  Do not use any products that contain nicotine  or tobacco, such as cigarettes or e-cigarettes. If you need help quitting, ask your health care provider.  Do not drink caffeinated beverages for at least 8 hours before going to bed. Coffee, tea, and some sodas contain caffeine.  Do not drink alcohol close to bedtime.  Do not eat large meals close to bedtime.  Do not take naps in the late afternoon.  Try to get at least 30 minutes of sunlight every day. Morning sunlight is best.  Make time to relax before bed. Reading, listening to music, or taking a hot bath promotes quality sleep.  Make your bedroom a place that promotes quality sleep. Keep your bedroom dark, quiet, and at a comfortable room temperature. Make sure your bed is comfortable. Take out sleep distractions like TV, a computer, smartphone, and bright lights.  If you are lying  awake in bed for longer than 20 minutes, get up and do a relaxing activity until you feel sleepy.  Work with your health care provider to treat medical conditions that may affect sleeping, such as: ? Nasal obstruction. ? Snoring. ? Sleep apnea and other sleep disorders.  Talk to your health care provider if you think any of your prescription medicines may cause you to have difficulty falling or staying asleep.  If you have sleep problems, talk with a sleep consultant. If you think you have a sleep disorder, talk with your health care provider about getting evaluated by a specialist. Where to find more information  National Sleep Foundation website: https://sleepfoundation.org  National Heart, Lung, and Blood Institute (NHLBI): https://hall.info/.pdf  Centers for Disease Control and Prevention (CDC): DetailSports.is Contact a health care provider if you:  Have trouble getting to sleep or staying asleep.  Often wake up very early in the morning and cannot get back to sleep.  Have daytime sleepiness.  Have daytime sleep attacks of suddenly falling asleep and sudden muscle weakness (narcolepsy).  Have a tingling sensation in your legs with a strong urge to move your legs (restless legs syndrome).  Stop breathing briefly during sleep (sleep apnea).  Think you have a sleep disorder or are taking a medicine that is affecting your quality of sleep. Summary  Most adults need 7-8 hours of quality sleep each night.  Getting enough quality sleep is an important part of health and well-being.  Make your bedroom a place that promotes quality sleep and avoid things that may cause you to have poor sleep, such as alcohol, caffeine, smoking, and large meals.  Talk to your health care provider if you have trouble falling asleep or staying asleep. This information is not intended to replace advice given to you by your health care provider. Make  sure you discuss any questions you have with your health care provider. Document Revised: 02/12/2018 Document Reviewed: 02/12/2018 Elsevier Patient Education  2020 [employer].

## 2020-04-19 NOTE — Progress Notes (Signed)
SLEEP MEDICINE CLINIC    Provider:  Larey Seat, MD  Primary Care Physician:  Vassie Moment, MD [address] [place] [address]     Referring Provider: Vassie Moment, [place],  [place] [address]          Chief Complaint according to patient   Patient presents with:    . New Patient (Initial Visit)     snoring, fatigue, headache; she wakes up with headaches sometimes. Yesterday she was groggy and if felt like she slept too much. Even if she sleeps a normal amount of hours she still is fatigued during the day. This has been going on about a month. She reports an average of 5.5 hours of sleep because of work hours. She does not get off until midnight.       HISTORY OF PRESENT ILLNESS:  Yolunda Kloos is a 39 year- old African American female patient and seen here upon referral on 04/19/2020 from Dr Lavonia Drafts.  Chief concern according to patient :      Annya Lizana  is a right -40 American female with a possible sleep disorder.  She has a  has a past medical history of Carpal tunnel syndrome, Intranasal polyp - and  Migraines, Prediabetes, and Vitamin D deficiency. She has not been vaccinated against COVID 19.   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  While she worked basically night shifts at the group home she is now working late shifts but she is going home usually around midnight.  She works now in a Theme park manager, in [employer].  Her sleep pattern still has changed there is more snoring, more fatigue and morning headaches.  Sometimes she wakes up with headaches but the headaches do not wake her. In February she felt ill and as suspected to have OVID but tested negative.  Test was done at [facility].   Sleep relevant medical history:  no Tonsillectomy, known nasal polyp- not removed yet- sinusitis  prone. She has a history of abnormal olfactory sensations.     Family medical /sleep history: No other family member with OSA, insomnia, no sleep walkers.    Social history:  Patient is working as a Sales executive  and lives in a household with 4 persons. Family status is single with a girlfriend and 2 children.  One daughter is 77 and one 26.   The patient currently works late shift / used to work in shifts( Presenter, broadcasting,) Pets are present, 4 cats. Tobacco use: none   ETOH use; socially ,  Caffeine intake in form of Coffee( 12 cup a day ) Soda(3/ week ) Tea ( soemtimes) or energy drinks. Regular exercise in form of gym, twice a week. Hobbies : gardening, flowers,     Sleep habits are as follows: The patient's dinner time is between 8-10  PM at work. The patient goes to bed at 2 PM and continues to struggle to sleep- once asleep for 8 hours, wakes for a rare  bathroom breaks,.   The preferred sleep position is on her sides, with the support of 1-2 pillows.  Dreams are reportedly frequent/sometimes vivid.   8 AM is the usual rise time.  The patient wakes up with an alarm set to 8 AM  She picks her girlfriend up  from work. .   She reports not feeling refreshed or restored in AM, with symptoms such as dry mouth, morning headaches , and residual fatigue.  Naps are taken frequently, lasting many hours - wakes up more groggy.    Review of Systems: Out of a complete 14 system review, the patient complains of only the following symptoms, and all other reviewed systems are negative.:  Fatigue, sleepiness , snoring, Insomnia - some trouble to initiate sleep, some hypersomnia. girlfriend reports she snores, kids confirmed.  Fit-bit indicated frequent wake times.     How likely are you to doze in the following situations: 0 = not likely, 1 = slight chance, 2 = moderate chance, 3 = high chance   Sitting and Reading? Watching Television? Sitting inactive in a public place (theater or  meeting)? As a passenger in a car for an hour without a break? Lying down in the afternoon when circumstances permit? Sitting and talking to someone? Sitting quietly after lunch without alcohol? In a car, while stopped for a few minutes in traffic?   Total = 12/ 24 points   FSS endorsed at 35/ 63 points.   Social History   Socioeconomic History  . Marital status: Single    Spouse name: Not on file  . Number of children: 2  . Years of education: Not on file  . Highest education level: High school graduate  Occupational History  . Not on file  Tobacco Use  . Smoking status: Never Smoker  . Smokeless tobacco: Never Used  Substance and Sexual Activity  . Alcohol use: Yes    Alcohol/week: 0.0 standard drinks    Comment: occ  . Drug use: No  . Sexual activity: Yes    Partners: Male    Birth control/protection: Surgical  Other Topics Concern  . Not on file  Social History Narrative   Lives at home with children & her partner   Right handed   Caffeine: has decreased, now maybe 0.5 cup coffee/day before she goes to work at 4 pm.       ** Merged History Encounter **       Social Determinants of Health   Financial Resource Strain:   . Difficulty of Paying Living Expenses:   Food Insecurity:   . Worried About Programme researcher, broadcasting/film/video in the Last Year:   . Barista in the Last Year:   Transportation Needs:   . Freight forwarder (Medical):   Marland Kitchen Lack of Transportation (Non-Medical):   Physical Activity:   . Days of Exercise per Week:   . Minutes of Exercise per Session:   Stress:   . Feeling of Stress :   Social Connections:   . Frequency of Communication with Friends and Family:   . Frequency of Social Gatherings with Friends and Family:   . Attends Religious Services:   . Active Member of Clubs or Organizations:   . Attends Banker Meetings:   Marland Kitchen Marital Status:     Family History  Problem Relation Age of Onset  . Breast cancer Maternal  Grandmother   . Breast cancer Maternal Great-grandmother   . Sleep apnea Neg Hx     Past Medical History:  Diagnosis Date  . Carpal tunnel syndrome   . Intranasal nodule   . Migraines   . Prediabetes   . Vitamin D deficiency     Past Surgical History:  Procedure Laterality Date  . TUBAL LIGATION  2009  Current Outpatient Medications on File Prior to Visit  Medication Sig Dispense Refill  . B Complex-C (SUPER B COMPLEX PO) Take by mouth.    . cholecalciferol (VITAMIN D) 1000 UNITS tablet Take 1,000 Units by mouth daily.    Marland Kitchen glucosamine-chondroitin 500-400 MG tablet Take 1 tablet by mouth 3 (three) times daily.    Marland Kitchen ibuprofen (ADVIL) 800 MG tablet Take 800 mg by mouth every 8 (eight) hours as needed.    . Multiple Vitamin (MULTIVITAMIN) capsule Take by mouth.    . Olopatadine HCl 0.7 % SOLN Place 1 drop into both eyes in the morning.    Marland Kitchen UNABLE TO FIND Med Name: Move Free Plus MSM-Vit D3 750 mg-100 mg-25 mcg oral tablet     No current facility-administered medications on file prior to visit.    Allergies  Allergen Reactions  . Nitrofurantoin Monohyd Macro     Vomiting blood    Physical exam:  Today's Vitals   04/19/20 1108  BP: 133/90  Pulse: 61  Weight: 174 lb (78.9 kg)  Height: 5\' 5"  (1.651 m)   Body mass index is 28.96 kg/m.   Wt Readings from Last 3 Encounters:  04/19/20 174 lb (78.9 kg)  09/16/18 166 lb 14.4 oz (75.7 kg)  05/16/17 168 lb (76.2 kg)     Ht Readings from Last 3 Encounters:  04/19/20 5\' 5"  (1.651 m)  09/16/18 5\' 5"  (1.651 m)  05/16/17 5\' 5"  (1.651 m)      General: The patient is awake, alert and appears not in acute distress. The patient is well groomed. Head: Normocephalic, atraumatic. Neck is supple. Mallampati 2, elongated uvula, but not reddened.   neck circumference:16 inches . Nasal airflow is barely patent.   Retrognathia is mild.  Dental status: intact-no barces, no retainers.  Cardiovascular:  Regular rate and cardiac  rhythm by pulse,  without distended neck veins. Respiratory: Lungs are clear to auscultation.  Skin:  Without evidence of ankle edema, or rash. Trunk: The patient's posture is erect.   Neurologic exam : The patient is awake and alert, oriented to place and time.   Memory subjective described as intact.  Attention span & concentration ability appears normal.  Speech is fluent,  without  dysarthria, dysphonia or aphasia.  Mood and affect are appropriate.   Cranial nerves: no loss of smell or taste reported -  Pupils are equal and briskly reactive to light. Funduscopic exam deferred.  Extraocular movements in vertical and horizontal planes were intact and without nystagmus. No Diplopia. Visual fields by finger perimetry are intact. Hearing was intact to soft voice and finger rubbing. Facial sensation intact to fine touch. Facial motor strength is symmetric and tongue and uvula move midline.  Neck ROM : rotation, tilt and flexion extension were normal for age and shoulder shrug was symmetrical.    Motor exam:  Symmetric bulk, tone and ROM.   Normal tone without cog wheeling, symmetric grip strength .   Sensory:  Fine touch, pinprick and vibration were tested and normal.  She reports numbness in the left hand when sleeping on a flexed elbow. Middle and Ringfinger on the left- and pinky and Ringfinger on the right.  Proprioception tested in the upper extremities was normal.   Coordination: Rapid alternating movements in the fingers/hands were of normal speed.  The Finger-to-nose maneuver was intact without evidence of ataxia, dysmetria or tremor.   Gait and station: Patient could rise unassisted from a seated position, walked without assistive device.  Stance is of normal width/ base and the patient turned with 3 steps.  Toe and heel walk were deferred.  Deep tendon reflexes: in the  upper and lower extremities are symmetric and intact.  Babinski response was deferred- all other DTR  normal       After spending a total time of  46 minutes face to face and additional time for physical and neurologic examination, review of laboratory studies,  personal review of imaging studies, reports and results of other testing and review of referral information / records as far as provided in visit, I have established the following assessments:  0)  Mrs. Hagg has a concern of morning headaches , reported and witnessed snoring and a feeling of not being restored with less than 6- hours of sleep .  She has implemented all sleep hygiene rules. Keeping routines.  She is a former night shift Financial controller.   1) she has dry mouth and nasal congestion.  2)  BMI is 28.9 /  3) hypersomnia with long sleep time alternates with insomnia. She has a tendency to oversleep and feeling groggy, even less refreshed, after doing so.  4) suspected ulnar and a carpal tunnel syndrome.     My Plan is to proceed with:  1)  We can use a HST or PSG to screen for apnea and treat accordingly. The patient is still not vaccinated against Covid 19 and could not undergo a SPLIT  study here for that reason.   I would like to thank Laruth Bouchard, MD  [address] [place],  [place] [address] for allowing me to meet with and to take care of this pleasant patient.   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    I plan to follow up either personally or through our NP within 3 month.   .  Electronically signed by: Melvyn Novas, MD 04/19/2020 11:27 AM  [facility] Board certified by The [employer] of Sleep Medicine and Diplomate of the [employer] of Sleep Medicine. Board certified In Neurology through the ABPN, Fellow of the [employer] of Neurology. Medical Director of [facility].

## 2020-04-26 ENCOUNTER — Telehealth: Payer: Self-pay

## 2020-04-26 NOTE — Telephone Encounter (Signed)
LVM for pt to call me back to schedule sleep study  

## 2020-05-11 ENCOUNTER — Ambulatory Visit (INDEPENDENT_AMBULATORY_CARE_PROVIDER_SITE_OTHER): Payer: Medicaid Other | Admitting: Neurology

## 2020-05-11 ENCOUNTER — Other Ambulatory Visit: Payer: Self-pay

## 2020-05-11 DIAGNOSIS — G4726 Circadian rhythm sleep disorder, shift work type: Secondary | ICD-10-CM

## 2020-05-11 DIAGNOSIS — G4710 Hypersomnia, unspecified: Secondary | ICD-10-CM

## 2020-05-11 DIAGNOSIS — R0683 Snoring: Secondary | ICD-10-CM

## 2020-05-26 NOTE — Procedures (Signed)
PATIENT'S NAME:  Jocelyn Townsend, Jocelyn Townsend DOB:      [birth year]      MR#:    [id number]     DATE OF RECORDING: 05/11/2020 Edwyna Ready  REFERRING M.D.:  Laruth Bouchard, MD Study Performed:   Baseline Polysomnogram HISTORY:     Jocelyn Townsend  is a right -handed African American female with a possible sleep disorder.  She has a past medical history of Carpal tunnel syndrome, Intranasal polyp - and Migraines, Prediabetes, and Vitamin D deficiency. She has not been vaccinated against COVID 19.  "Patient reports snoring, fatigue, morning headaches; she wakes up with headaches sometimes. Yesterday, she was groggy and felt like she slept too much. Even if she sleeps a normal amount of hours she still is fatigued during the day. This has been going on about a month. She reports an average of 5.5 hours of sleep because of work hours. 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  The patient endorsed the Epworth Sleepiness Scale at 12/24 points and FSS at 35/63 points..   The patient's weight 174 pounds with a height of 65 (inches), resulting in a BMI of 29. kg/m2. The patient's neck circumference measured 16 inches.  CURRENT MEDICATIONS: Vit D, Glucosamine-chondroitin, Multivitamin, Advil, B-Complex   PROCEDURE:  This is a multichannel digital polysomnogram utilizing the Somnostar 11.2 system.  Electrodes and sensors were applied and monitored per AASM Specifications.   EEG, EOG, Chin and Limb EMG, were sampled at 200 Hz.  ECG, Snore and Nasal Pressure, Thermal Airflow, Respiratory Effort, CPAP Flow and Pressure, Oximetry was sampled at 50 Hz. Digital video and audio were recorded.      BASELINE STUDY: Lights Out was at 22:04 and Lights On at 04:55.  Total recording time (TRT) was 411.5 minutes, with a total sleep time (TST) of 379.5 minutes.   The patient's sleep latency was 22.5 minutes.  REM latency was 76 minutes.  The sleep efficiency was 92.2 %.     SLEEP ARCHITECTURE: WASO (Wake after sleep onset) was 12.5 minutes.  There were  9 minutes in Stage N1, 246 minutes Stage N2, 30.5 minutes Stage N3 and 94 minutes in Stage REM.  The percentage of Stage N1 was 2.4%, Stage N2 was 64.8%, Stage N3 was 8.% and Stage R (REM sleep) was 24.8%.   RESPIRATORY ANALYSIS:  There were a total of 0 respiratory events:  0 obstructive apneas, 0 central apneas and 0 mixed apneas with a total of 0 apneas and an apnea index (AI) of 0 /hour. There were 0 hypopneas with a hypopnea index of 0 /hour.  The total APNEA/HYPOPNEA INDEX (AHI) was 0/hour.  0 events occurred in REM sleep and 0 events in NREM. The REM AHI was  0 /hour, versus a non-REM AHI of 0. The patient spent 159.5 minutes of total sleep time in the supine position and 220 minutes in non-supine. The supine AHI was 0.0 versus a non-supine AHI of 0.0.  OXYGEN SATURATION & C02:  The Wake baseline 02 saturation was 96%, with the lowest being 91%. Time spent below 89% saturation equaled 0 minutes.  The arousals were noted as: 55 were spontaneous, 0 were associated with PLMs, 0 were associated with respiratory events. The patient had a total of 0 Periodic Limb Movements.   Audio and video analysis did not show any abnormal or unusual movements, behaviors, phonations or vocalizations.   Audible breathing, mild Snoring was noted in supine sleep position.. EKG was in keeping with normal sinus  rhythm (NSR).  IMPRESSION:  1. No evidence of sleep breathing disorder, no Obstructive Sleep Apnea (OSA) 2. No Periodic Limb Movement Disorder (PLMD). Normal sleep architecture between 22.30 hours and 5 AM. =  RECOMMENDATIONS: no evidence of any organic sleep disorder.   Sleep behaviors may need to change to earlier bedtime, extending sleep to 7 hours as tolerated.  Deficient sleep time can explain most of the patient's symptoms.  Consider cognitive behavior therapy if insomnia / hypersomnia persists after sleep time has been allocated.  No follow up in the sleep clinic is needed.   I certify that I  have reviewed the entire raw data recording prior to the issuance of this report in accordance with the Standards of Accreditation of the American Academy of Sleep Medicine (AASM)   Melvyn Novas, MD Diplomat, American Board of Psychiatry and Neurology  Diplomat, American Board of Sleep Medicine Wellsite geologist, [place] [facility]

## 2020-05-26 NOTE — Progress Notes (Signed)
IMPRESSION:   1. No evidence of sleep breathing disorder, no Obstructive Sleep  Apnea (OSA)  2. No Periodic Limb Movement Disorder (PLMD). Normal sleep  architecture between 22.30 hours and 5 AM.  =   RECOMMENDATIONS: no evidence of any organic sleep disorder.   Sleep behaviors may need to change to earlier bedtime, extending  sleep to 7 hours as tolerated.  Deficient sleep time can explain most of the patient's symptoms.  Consider cognitive behavior therapy if insomnia / hypersomnia  persists after sleep time has been allocated.  No follow up in the sleep clinic is needed.

## 2020-05-27 ENCOUNTER — Encounter: Payer: Self-pay | Admitting: Neurology

## 2020-08-31 ENCOUNTER — Other Ambulatory Visit: Payer: Self-pay | Admitting: Family Medicine

## 2020-08-31 DIAGNOSIS — R1909 Other intra-abdominal and pelvic swelling, mass and lump: Secondary | ICD-10-CM

## 2020-09-12 ENCOUNTER — Other Ambulatory Visit: Payer: Self-pay | Admitting: Family Medicine

## 2020-09-15 ENCOUNTER — Ambulatory Visit
Admission: RE | Admit: 2020-09-15 | Discharge: 2020-09-15 | Disposition: A | Discharge status: Home / Self Care | Payer: Medicaid Other | Source: Ambulatory Visit | Attending: Family Medicine | Admitting: Family Medicine

## 2020-09-15 DIAGNOSIS — R1909 Other intra-abdominal and pelvic swelling, mass and lump: Secondary | ICD-10-CM

## 2020-09-15 MED ORDER — IOPAMIDOL (ISOVUE-300) INJECTION 61%
100.0000 mL | Freq: Once | INTRAVENOUS | Status: AC | PRN
  Administered 2020-09-15: 100 mL via INTRAVENOUS

## 2020-11-01 DIAGNOSIS — Z23 Encounter for immunization: Secondary | ICD-10-CM | POA: Diagnosis not present

## 2020-11-05 DIAGNOSIS — Z20822 Contact with and (suspected) exposure to covid-19: Secondary | ICD-10-CM | POA: Diagnosis not present

## 2020-11-22 DIAGNOSIS — Z23 Encounter for immunization: Secondary | ICD-10-CM | POA: Diagnosis not present

## 2020-12-21 ENCOUNTER — Ambulatory Visit (INDEPENDENT_AMBULATORY_CARE_PROVIDER_SITE_OTHER): Payer: Medicaid Other

## 2020-12-21 ENCOUNTER — Other Ambulatory Visit: Payer: Self-pay

## 2020-12-21 ENCOUNTER — Ambulatory Visit (HOSPITAL_COMMUNITY)
Admission: EM | Admit: 2020-12-21 | Discharge: 2020-12-21 | Disposition: A | Discharge status: Home / Self Care | Payer: Medicaid Other | Attending: Family Medicine | Admitting: Family Medicine

## 2020-12-21 ENCOUNTER — Encounter (HOSPITAL_COMMUNITY): Payer: Self-pay | Admitting: Emergency Medicine

## 2020-12-21 DIAGNOSIS — M7989 Other specified soft tissue disorders: Secondary | ICD-10-CM | POA: Diagnosis not present

## 2020-12-21 DIAGNOSIS — M79621 Pain in right upper arm: Secondary | ICD-10-CM

## 2020-12-21 DIAGNOSIS — W19XXXA Unspecified fall, initial encounter: Secondary | ICD-10-CM

## 2020-12-21 DIAGNOSIS — M25521 Pain in right elbow: Secondary | ICD-10-CM | POA: Diagnosis not present

## 2020-12-21 DIAGNOSIS — M79601 Pain in right arm: Secondary | ICD-10-CM | POA: Diagnosis not present

## 2020-12-21 DIAGNOSIS — M25511 Pain in right shoulder: Secondary | ICD-10-CM | POA: Diagnosis not present

## 2020-12-21 MED ORDER — PREDNISONE 20 MG PO TABS: 40.0000 mg | ORAL_TABLET | Freq: Every day | ORAL | 0 refills | Status: DC

## 2020-12-21 NOTE — ED Triage Notes (Signed)
Pt presents today with c/o of right arm/shoulder pain and swelling from fall that occurred at work on 10/06/20. She has not had it treated. No deformity.

## 2020-12-21 NOTE — ED Provider Notes (Signed)
[facility]   [id number] 12/21/20 Arrival Time: 0957  ASSESSMENT & PLAN:  1. Right elbow pain     I have personally viewed the imaging studies ordered this visit. No elbow fx appreciated.  Begin trial of: Meds ordered this encounter  Medications  . predniSONE (DELTASONE) 20 MG tablet    Sig: Take 2 tablets (40 mg total) by mouth daily.    Dispense:  10 tablet    Refill:  0   Has tennis elbow brace at home to use over next week. Activities as tolerated.   Recommend:  Follow-up Information    [facility].   Why: If worsening or failing to improve as anticipated. Contact information: [address] [place] [address] [phone]             Reviewed expectations re: course of current medical issues. Questions answered. Outlined signs and symptoms indicating need for more acute intervention. Patient verbalized understanding. After Visit Summary given.  SUBJECTIVE: History from: patient. Jocelyn Townsend is a 40 y.o. female who reports R elbow pain/soreness after fall onto elbow 2 m ago. On/off; sometimes worse with certain movements "and when I hit it on something". No extremity sensation changes or weakness. OTC analgesics with some help. No swelling reported.   Past Surgical History:  Procedure Laterality Date  . TUBAL LIGATION  2009      OBJECTIVE:  Vitals:   12/21/20 1046 12/21/20 1047  BP: 136/82   Pulse: 70   Resp: 18   Temp: 98.8 F (37.1 C)   TempSrc: Oral   SpO2: 100%   Weight:  74.4 kg  Height:  5\' 5"  (1.651 m)    General appearance: alert; no distress HEENT: [place]; AT Neck: supple with FROM Resp: unlabored respirations Extremities: . RUE: warm with well perfused appearance; fairly well localized moderate tenderness over right lateral epicondyle; without gross deformities; swelling: none; bruising: none; elbow and shoulder ROM: normal CV: brisk extremity capillary refill of RUE; 2+  radial pulse of RUE. Skin: warm and dry; no visible rashes Neurologic: gait normal; normal sensation and strength of RUE Psychological: alert and cooperative; normal mood and affect  Imaging: DG Elbow Complete Right  Result Date: 12/21/2020 CLINICAL DATA:  Right arm/shoulder pain and swelling from a fall that occurred in November 2021. Initial encounter. EXAM: RIGHT ELBOW - COMPLETE 3+ VIEW COMPARISON:  None. FINDINGS: No evidence of acute trauma. Prominent enthesophyte off the proximal ulna. IMPRESSION: No evidence of acute or remote trauma. Electronically Signed   By: December 2021 M.D.   On: 12/21/2020 11:20      Allergies  Allergen Reactions  . Nitrofurantoin Monohyd Macro     Vomiting blood    Past Medical History:  Diagnosis Date  . Carpal tunnel syndrome   . Intranasal nodule   . Migraines   . Prediabetes   . Vitamin D deficiency    Social History   Socioeconomic History  . Marital status: Significant Other    Spouse name: Not on file  . Number of children: 2  . Years of education: Not on file  . Highest education level: High school graduate  Occupational History  . Not on file  Tobacco Use  . Smoking status: Never Smoker  . Smokeless tobacco: Never Used  Vaping Use  . Vaping Use: Never used  Substance and Sexual Activity  . Alcohol use: Yes    Alcohol/week: 0.0 standard drinks    Comment: occ  .  Drug use: No  . Sexual activity: Yes    Partners: Male    Birth control/protection: Surgical  Other Topics Concern  . Not on file  Social History Narrative   Lives at home with children & her partner   Right handed   Caffeine: has decreased, now maybe 0.5 cup coffee/day before she goes to work at 4 pm.       ** Merged History Encounter **       Social Determinants of Health   Financial Resource Strain: Not on file  Food Insecurity: Not on file  Transportation Needs: Not on file  Physical Activity: Not on file  Stress: Not on file  Social Connections:  Not on file   Family History  Problem Relation Age of Onset  . Breast cancer Maternal Grandmother   . Breast cancer Maternal Great-grandmother   . Sleep apnea Neg Hx    Past Surgical History:  Procedure Laterality Date  . TUBAL LIGATION  2009      Mardella Layman, MD 12/21/20 [phone]

## 2021-01-24 ENCOUNTER — Encounter (HOSPITAL_COMMUNITY): Payer: Self-pay

## 2021-01-24 ENCOUNTER — Ambulatory Visit (HOSPITAL_COMMUNITY)
Admission: EM | Admit: 2021-01-24 | Discharge: 2021-01-24 | Disposition: A | Discharge status: Home / Self Care | Payer: Medicaid Other | Attending: Student | Admitting: Student

## 2021-01-24 ENCOUNTER — Other Ambulatory Visit: Payer: Self-pay

## 2021-01-24 DIAGNOSIS — M654 Radial styloid tenosynovitis [de Quervain]: Secondary | ICD-10-CM | POA: Diagnosis not present

## 2021-01-24 DIAGNOSIS — G5601 Carpal tunnel syndrome, right upper limb: Secondary | ICD-10-CM | POA: Diagnosis not present

## 2021-01-24 DIAGNOSIS — M7711 Lateral epicondylitis, right elbow: Secondary | ICD-10-CM | POA: Diagnosis not present

## 2021-01-24 MED ORDER — IBUPROFEN 800 MG PO TABS: 800.0000 mg | ORAL_TABLET | Freq: Every day | ORAL | 0 refills | Status: DC

## 2021-01-24 MED ORDER — PREDNISONE 20 MG PO TABS: 20.0000 mg | ORAL_TABLET | Freq: Every day | ORAL | 0 refills | Status: AC

## 2021-01-24 NOTE — ED Provider Notes (Signed)
[facility]    CSN: [id number] Arrival date & time: 01/24/21  0809      History   Chief Complaint Chief Complaint  Patient presents with  . Hand Pain    Rt thumb    HPI Jocelyn Townsend is a 40 y.o. female presenting with acute exacerbation of chronic carpal tunnel syndrome. History carpal tunnel, migraines, prediabetes, tennis elbow. She is right handed. Currently being followed by ortho and physical therapy for tennis elbow and carpal tunnel. -describes tingling in R thumb and middle finger for 2 days. Pain and stiffness with movement of thumb. States she uses her hands a lot at work but denies trauma.  -Currently being treated for tennis elbow and continues to experience R lateral epicondyle pain related to this. Per pt her orthopedist is considering "burning off" her radial nerve.  -Denies new trauma but does use her hands a lot for work.  HPI  Past Medical History:  Diagnosis Date  . Carpal tunnel syndrome   . Intranasal nodule   . Migraines   . Prediabetes   . Vitamin D deficiency     Patient Active Problem List   Diagnosis Date Noted  . Hypersomnia, unspecified 04/19/2020  . Loud snoring 04/19/2020  . 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 Well woman exam 10/20/2015  . 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 TUBAL LIGATION  2009    OB History    Gravida  4   Para  2   Term  2   Preterm      AB  2   Living  2     SAB  1   IAB  1   Ectopic      Multiple      Live Births  2            Home Medications    Prior to Admission medications   Medication Sig Start Date End Date Taking? Authorizing Provider  ibuprofen (ADVIL) 800 MG tablet Take 1 tablet (800 mg total) by mouth daily. 01/24/21  Yes Rhys Martini, PA-C  predniSONE (DELTASONE) 20 MG tablet Take 1 tablet (20 mg total) by mouth daily for 5 days. 01/24/21 01/29/21 Yes Rhys Martini, PA-C  B  Complex-C (SUPER B COMPLEX PO) Take by mouth.    [provider]  cholecalciferol (VITAMIN D) 1000 UNITS tablet Take 1,000 Units by mouth daily.    [provider]  glucosamine-chondroitin 500-400 MG tablet Take 1 tablet by mouth 3 (three) times daily.    [provider]  Melatonin 2.5 MG CHEW Take one at bedtime po 04/19/20   Dohmeier, Porfirio Mylar, MD  Multiple Vitamin (MULTIVITAMIN) capsule Take by mouth.    [provider]  Olopatadine HCl 0.7 % SOLN Place 1 drop into both eyes in the morning.    [provider]  UNABLE TO FIND Med Name: Move Free Plus MSM-Vit D3 750 mg-100 mg-25 mcg oral tablet    [provider]    Family History Family History  Problem Relation Age of Onset  . Breast cancer Maternal Grandmother   . Breast cancer Maternal Great-grandmother   . Sleep apnea Neg Hx     Social History Social History   Tobacco Use  . Smoking status: Never Smoker  . Smokeless tobacco: Never Used  Vaping Use  . Vaping Use: Never used  Substance Use Topics  . Alcohol  use: Yes    Alcohol/week: 0.0 standard drinks    Comment: occ  . Drug use: No     Allergies   Nitrofurantoin monohyd macro   Review of Systems Review of Systems  Musculoskeletal:       R elbow, thumb pain Sensation changes R middle finger and thumb  All other systems reviewed and are negative.    Physical Exam Triage Vital Signs ED Triage Vitals  Enc Vitals Group     BP 01/24/21 0851 123/73     Pulse Rate 01/24/21 0851 67     Resp 01/24/21 0851 18     Temp 01/24/21 0851 98.1 F (36.7 C)     Temp Source 01/24/21 0851 Oral     SpO2 01/24/21 0851 100 %     Weight --      Height --      Head Circumference --      Peak Flow --      Pain Score 01/24/21 0850 8     Pain Loc --      Pain Edu? --      Excl. in GC? --    No data found.  Updated Vital Signs BP 123/73 (BP Location: Right Arm)   Pulse 67   Temp 98.1 F (36.7 C) (Oral)   Resp 18   LMP  01/02/2021 (Approximate)   SpO2 100%   Visual Acuity Right Eye Distance:   Left Eye Distance:   Bilateral Distance:    Right Eye Near:   Left Eye Near:    Bilateral Near:     Physical Exam Vitals reviewed.  Constitutional:      Appearance: Normal appearance.  HENT:     Head: Normocephalic and atraumatic.  Cardiovascular:     Rate and Rhythm: Normal rate and regular rhythm.     Heart sounds: Normal heart sounds.  Pulmonary:     Effort: Pulmonary effort is normal.     Breath sounds: Normal breath sounds.  Musculoskeletal:     Comments: R thumb with thenar eminence pain. ROM thumb limited due to stiffness and pain. Positive finkelstein. sensation intact. Cap refill <2 seconds.   Positive tinel and phalen sign.  Grip strength 4/5 R hand and 5/5 L hand.  Pain over lateral epicondyle R elbow.  Skin:    Capillary Refill: Capillary refill takes less than 2 seconds.  Neurological:     General: No focal deficit present.     Mental Status: She is alert.  Psychiatric:        Mood and Affect: Mood normal.        Behavior: Behavior normal.        Thought Content: Thought content normal.        Judgment: Judgment normal.      UC Treatments / Results  Labs (all labs ordered are listed, but only abnormal results are displayed) Labs Reviewed - No data to display  EKG   Radiology No results found.  Procedures Procedures (including critical care time)  Medications Ordered in UC Medications - No data to display  Initial Impression / Assessment and Plan / UC Course  I have reviewed the triage vital signs and the nursing notes.  Pertinent labs & imaging results that were available during my care of the patient were reviewed by me and considered in my medical decision making (see chart for details).     this patient is a 40 year old female presenting with acute exacerbation of chronic carpal tunnel, with  new onset of dequervain's tenosynovitis. History of tennis elbow and  issues with radial nerve in the past per pt.  Plan to treat with wrist brace, NSAIDs, low dose of prednisone as below. She is not a diabetic.  Continue to follow with ortho and physical therapy. rec discussing carpal tunnel with them. She verbalizes understanding and agreement. Return precautions discussed.  This chart was dictated using voice recognition software, Dragon. Despite the best efforts of this provider to proofread and correct errors, errors may still occur which can change documentation meaning.   Final Clinical Impressions(s) / UC Diagnoses   Final diagnoses:  Carpal tunnel syndrome on right  Lateral epicondylitis of right elbow  De Quervain's disease (tenosynovitis)     Discharge Instructions     -Wear wrist brace at night for at least 2 weeks.  You may find that it helps to wear this during the day as well. -I sent a prescription for a steroid-prednisone.  Take 1 pill daily for 5 days in a row.  Try taking this in the morning as it can give you energy. -I also sent a prescription for ibuprofen.  This is a pain reliever and anti-inflammatory.  Take 1 pill (800 mg) by mouth daily with food for 7 days. -Consider following up with your orthopedist or physical therapist for further evaluation and management.   ED Prescriptions    Medication Sig Dispense Auth. Provider   predniSONE (DELTASONE) 20 MG tablet Take 1 tablet (20 mg total) by mouth daily for 5 days. 5 tablet Rhys Martini, PA-C   ibuprofen (ADVIL) 800 MG tablet Take 1 tablet (800 mg total) by mouth daily. 7 tablet Rhys Martini, PA-C     PDMP not reviewed this encounter.   Rhys Martini, PA-C 01/24/21 1014

## 2021-01-24 NOTE — ED Triage Notes (Signed)
Pt c/o right thumb pain. Pt states she is unable to move her thumb X 2 days. She states she does not know what has caused the pain.

## 2021-01-24 NOTE — Discharge Instructions (Signed)
-  Wear wrist brace at night for at least 2 weeks.  You may find that it helps to wear this during the day as well. -I sent a prescription for a steroid-prednisone.  Take 1 pill daily for 5 days in a row.  Try taking this in the morning as it can give you energy. -I also sent a prescription for ibuprofen.  This is a pain reliever and anti-inflammatory.  Take 1 pill (800 mg) by mouth daily with food for 7 days. -Consider following up with your orthopedist or physical therapist for further evaluation and management.

## 2021-01-25 ENCOUNTER — Other Ambulatory Visit: Payer: Self-pay

## 2021-01-25 ENCOUNTER — Encounter (HOSPITAL_COMMUNITY): Payer: Self-pay | Admitting: Emergency Medicine

## 2021-01-25 ENCOUNTER — Emergency Department (HOSPITAL_COMMUNITY)
Admission: EM | Admit: 2021-01-25 | Discharge: 2021-01-25 | Disposition: A | Discharge status: Home / Self Care | Payer: Medicaid Other | Attending: Emergency Medicine | Admitting: Emergency Medicine

## 2021-01-25 ENCOUNTER — Emergency Department (HOSPITAL_COMMUNITY): Payer: Medicaid Other

## 2021-01-25 DIAGNOSIS — M654 Radial styloid tenosynovitis [de Quervain]: Secondary | ICD-10-CM | POA: Insufficient documentation

## 2021-01-25 DIAGNOSIS — G5601 Carpal tunnel syndrome, right upper limb: Secondary | ICD-10-CM | POA: Diagnosis not present

## 2021-01-25 DIAGNOSIS — M79641 Pain in right hand: Secondary | ICD-10-CM | POA: Diagnosis present

## 2021-01-25 DIAGNOSIS — M79644 Pain in right finger(s): Secondary | ICD-10-CM | POA: Diagnosis not present

## 2021-01-25 NOTE — ED Notes (Signed)
Called ortho tech regarding splint need of patient.

## 2021-01-25 NOTE — ED Triage Notes (Signed)
Patient complains of right thumb pain that she first noticed on Monday morning, states she frequently gets her hand stuck in a door at work and thinks she may have broken it. Patient alert, oriented, and in no apparent distress at this time.

## 2021-01-25 NOTE — Progress Notes (Signed)
Orthopedic Tech Progress Note Patient Details:  Jocelyn Townsend Aug 07, 1981 [id number]  Ortho Devices Type of Ortho Device: Thumb velcro splint Ortho Device/Splint Location: Right Hand Ortho Device/Splint Interventions: Application   Post Interventions Patient Tolerated: Well   Jocelyn Townsend 01/25/2021, 7:50 PM

## 2021-01-25 NOTE — Discharge Instructions (Signed)
Be sure to wear your wrist brace as close to 24 hours a day for the next 7 days. You may take it off when bathing, washing dishes, ect... Continue taking steroids and ibuprofen as prescribed. Follow up with sports medicine, if not improving.   Take Care,   Dr. Katherina Right Emergency Department

## 2021-01-25 NOTE — ED Provider Notes (Signed)
[facility] EMERGENCY DEPARTMENT Provider Note   CSN: [id number] Arrival date & time: 01/25/21  1610     History Chief Complaint  Patient presents with  . Hand Pain    Lalania Haseman is a 40 y.o. female with history of carpal tunnel.  HPI  Patient is a 40 year old female presenting with persistent right hand pain that began 3 to 4 days ago.  Patient reports tingling in her right thumb.  Pain worse with movement especially extension.  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 She occassionally flaps her hands.  She is right-handed.  States that she went to the urgent care yesterday and was given steroids and ibuprofen but this is not helping.  Reports that she has seen sports medicine in the past for tennis elbow, carpal tunnel and arthritis.  Pain is similar to previous hand pain episodes.  She has a brace that she wears at night but this brace does not include her thumb.  Denies current elbow pain, recent trauma, bug bites, rash or fever.       Past Medical History:  Diagnosis Date  . Carpal tunnel syndrome   . Intranasal nodule   . Migraines   . Prediabetes   . Vitamin D deficiency     Patient Active Problem List   Diagnosis Date Noted  . Hypersomnia, unspecified 04/19/2020  . Loud snoring 04/19/2020  . 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 Well woman exam 10/20/2015  . 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 TUBAL LIGATION  2009     OB History    Gravida  4   Para  2   Term  2   Preterm      AB  2   Living  2     SAB  1   IAB  1   Ectopic      Multiple      Live Births  2           Family History  Problem Relation Age of Onset  . Breast cancer Maternal Grandmother   . Breast cancer Maternal Great-grandmother   . Sleep apnea Neg Hx     Social History   Tobacco Use  . Smoking status: Never Smoker  . Smokeless tobacco: Never  Used  Vaping Use  . Vaping Use: Never used  Substance Use Topics  . Alcohol use: Yes    Alcohol/week: 0.0 standard drinks    Comment: occ  . Drug use: No    Home Medications Prior to Admission medications   Medication Sig Start Date End Date Taking? Authorizing Provider  B Complex-C (SUPER B COMPLEX PO) Take by mouth.    [provider]  cholecalciferol (VITAMIN D) 1000 UNITS tablet Take 1,000 Units by mouth daily.    [provider]  glucosamine-chondroitin 500-400 MG tablet Take 1 tablet by mouth 3 (three) times daily.    [provider]  ibuprofen (ADVIL) 800 MG tablet Take 1 tablet (800 mg total) by mouth daily. 01/24/21   Rhys Martini, PA-C  Melatonin 2.5 MG CHEW Take one at bedtime po 04/19/20   Dohmeier, Porfirio Mylar, MD  Multiple Vitamin (MULTIVITAMIN) capsule Take by mouth.    [provider]  Olopatadine HCl 0.7 % SOLN Place 1 drop into both eyes in the morning.    [provider]  predniSONE (DELTASONE) 20 MG  tablet Take 1 tablet (20 mg total) by mouth daily for 5 days. 01/24/21 01/29/21  Rhys Martini, PA-C  UNABLE TO FIND Med Name: Move Free Plus MSM-Vit D3 750 mg-100 mg-25 mcg oral tablet    [provider]    Allergies    Nitrofurantoin monohyd macro  Review of Systems   Review of Systems  Constitutional: Negative for fever.  Musculoskeletal:       Right thumb pain, swelling  Skin: Negative for rash.  Neurological: Positive for weakness and numbness.  All other systems reviewed and are negative. See HPI  Physical Exam Updated Vital Signs BP 130/73 (BP Location: Left Arm)   Pulse 72   Temp 98.7 F (37.1 C)   Resp 16   Ht 5\' 5"  (1.651 m)   Wt 74.4 kg   LMP 01/02/2021 (Approximate)   SpO2 100%   BMI 27.29 kg/m   Physical Exam Vitals and nursing note reviewed.  Constitutional:      Appearance: Normal appearance. She is not ill-appearing.  HENT:     Head: Normocephalic and atraumatic.     Nose: Nose  normal.  Eyes:     Extraocular Movements: Extraocular movements intact.     Conjunctiva/sclera: Conjunctivae normal.  Cardiovascular:     Rate and Rhythm: Normal rate.     Pulses: Normal pulses.  Pulmonary:     Effort: Pulmonary effort is normal. No respiratory distress.  Musculoskeletal:        General: Swelling (moderate right thumb and index finger) and tenderness present. No deformity or signs of injury.     Cervical back: Normal range of motion.     Comments: Right thumb: Inspection yielded thumb edema, no erythema, ecchymosis, bony deformity. ROM limited 2/2 pain. Pain with extension. TTP over 1st [facility] joint and PIP. No tenderness over 2-5th metacarpals, scaphoid;  Strength 3+/5 in right thumb, 5/5 in 2-5th digits and wrist in all directions without pain. Positive Finkelstein. Positive Tinel sign.    Neurological:     Mental Status: She is alert.     ED Results / Procedures / Treatments   Labs (all labs ordered are listed, but only abnormal results are displayed) Labs Reviewed - No data to display  EKG None  Radiology DG Hand Complete Right  Result Date: 01/25/2021 CLINICAL DATA:  Right thumb pain. EXAM: RIGHT HAND - COMPLETE 3+ VIEW COMPARISON:  None. FINDINGS: There is no evidence of fracture or dislocation. There is no evidence of arthropathy or other focal bone abnormality. Soft tissues are unremarkable. IMPRESSION: Negative. Electronically Signed   By: 03/27/2021 M.D.   On: 01/25/2021 17:38    Procedures Procedures   Medications Ordered in ED Medications - No data to display  ED Course  I have reviewed the triage vital signs and the nursing notes.  Pertinent labs & imaging results that were available during my care of the patient were reviewed by me and considered in my medical decision making (see chart for details).    MDM Rules/Calculators/A&P                          Pt is a 40 yo female with hx of carpal tunnel (previously declined surgery) who  presented for 4 days of right hand pain. UC note reviewed. Exam concerning for caral tunnel exacerbation with ?41 tendosynovitis. Right hand plain films without arthropathy, dislocation or fracture. Of note, pt does not have a thumb spica splint.  Will provide thumb spica for pt. Advised to wear 24hr daily for next 7 days. Continue prednisone and ibuprofen.  Follow up with SM and PT as previously planned by UC provider. Patient agrees with plan.   Final Clinical Impression(s) / ED Diagnoses Final diagnoses:  De Quervain's tenosynovitis, right  Carpal tunnel syndrome of right wrist    Rx / DC Orders ED Discharge Orders    None       Katha Cabal, DO 01/25/21 2026    Tilden Fossa, MD 01/28/21 1918

## 2021-02-19 ENCOUNTER — Emergency Department (HOSPITAL_COMMUNITY): Payer: Medicaid Other

## 2021-02-19 ENCOUNTER — Emergency Department (HOSPITAL_COMMUNITY)
Admission: EM | Admit: 2021-02-19 | Discharge: 2021-02-19 | Disposition: A | Discharge status: Home / Self Care | Payer: Medicaid Other | Attending: Emergency Medicine | Admitting: Emergency Medicine

## 2021-02-19 ENCOUNTER — Encounter (HOSPITAL_COMMUNITY): Payer: Self-pay | Admitting: Emergency Medicine

## 2021-02-19 ENCOUNTER — Other Ambulatory Visit: Payer: Self-pay

## 2021-02-19 DIAGNOSIS — G5600 Carpal tunnel syndrome, unspecified upper limb: Secondary | ICD-10-CM | POA: Insufficient documentation

## 2021-02-19 DIAGNOSIS — M25532 Pain in left wrist: Secondary | ICD-10-CM | POA: Diagnosis not present

## 2021-02-19 DIAGNOSIS — M7989 Other specified soft tissue disorders: Secondary | ICD-10-CM | POA: Diagnosis not present

## 2021-02-19 MED ORDER — NAPROXEN 500 MG PO TABS: 500.0000 mg | ORAL_TABLET | Freq: Two times a day (BID) | ORAL | 0 refills | Status: DC | PRN

## 2021-02-19 NOTE — Discharge Instructions (Signed)
Please read and follow all provided instructions.  You have been seen today for left wrist injury.   Tests performed today include: An x-ray of the affected area - does NOT show any broken bones or dislocations it does show findings of a likely chronic injury to one of the ligaments in your hand. Vital signs. See below for your results today.   Home care instructions: -- *PRICE in the first 24-48 hours after injury: Protect (with brace, splint, sling), if given by your provider Rest Ice- Do not apply ice pack directly to your skin, place towel or similar between your skin and ice/ice pack. Apply ice for 20 min, then remove for 40 min while awake Compression- Wear brace, elastic bandage, splint as directed by your provider Elevate affected extremity above the level of your heart when not walking around for the first 24-48 hours   Medications:  - Naproxen is a nonsteroidal anti-inflammatory medication that will help with pain and swelling. Be sure to take this medication as prescribed with food, 1 pill every 12 hours,  It should be taken with food, as it can cause stomach upset, and more seriously, stomach bleeding. Do not take other nonsteroidal anti-inflammatory medications with this such as Advil, Motrin, Aleve, Mobic, Goodie Powder, or Motrin.    You make take Tylenol per over the counter dosing with these medications.   We have prescribed you new medication(s) today. Discuss the medications prescribed today with your pharmacist as they can have adverse effects and interactions with your other medicines including over the counter and prescribed medications. Seek medical evaluation if you start to experience new or abnormal symptoms after taking one of these medicines, seek care immediately if you start to experience difficulty breathing, feeling of your throat closing, facial swelling, or rash as these could be indications of a more serious allergic reaction   Follow-up instructions: Please  follow-up with your primary care provider or the provided orthopedic physician (bone specialist) if you continue to have significant pain in 1 week. In this case you may have a more severe injury that requires further care.   Return instructions:  Please return if your digits or extremity are numb or tingling, appear gray or blue, or you have severe pain (also elevate the extremity and loosen splint or wrap if you were given one) Please return if you have redness or fevers.  Please return to the Emergency Department if you experience worsening symptoms.  Please return if you have any other emergent concerns. Additional Information:  Your vital signs today were: BP 135/80 (BP Location: Right Arm)   Pulse 70   Temp 98.8 F (37.1 C) (Oral)   Resp 19   LMP 01/31/2021   SpO2 99%  If your blood pressure (BP) was elevated above 135/85 this visit, please have this repeated by your doctor within one month. ---------------

## 2021-02-19 NOTE — ED Triage Notes (Addendum)
Pt reports L wrist pain and swelling that started while working on a car yesterday and squeezing a clamp.  States she heard crunching and popping.

## 2021-02-19 NOTE — ED Provider Notes (Signed)
Patient placed in Quick Look pathway, seen and evaluated    Chief Complaint: Left wrist pain  HPI:   40 year old female presents with pain in the left wrist.  This started after she was working on a car yesterday and squeezing on a clamp and felt a pop and crunch in her wrist, pain has gotten worse since then and she has noticed slight swelling.  Pain is worse with movement.  ROS: + Wrist pain   - numbness, wound  Physical Exam:   Gen: No distress  Neuro: Awake and Alert  Skin: Warm    Focused Exam: Tenderness over the radial aspect of the left wrist with slight swelling noted   Initiation of care has begun. The patient has been counseled on the process, plan, and necessity for staying for the completion/evaluation, and the remainder of the medical screening examination  MSE was initiated and I personally evaluated the patient and placed orders (if any) at  3:57 PM on February 19, 2021.  The patient appears stable so that the remainder of the MSE may be completed by another provider.   Dartha Lodge, PA-C 02/19/21 1559    Koleen Distance, MD 02/19/21 [phone]

## 2021-02-19 NOTE — ED Notes (Signed)
ORTHO TECH PAGED AT THIS TIME 

## 2021-02-19 NOTE — ED Notes (Signed)
REMAINS OUT OF ROOM AT THIS TIME

## 2021-02-19 NOTE — ED Provider Notes (Signed)
[facility] EMERGENCY DEPARTMENT Provider Note   CSN: [id number] Arrival date & time: 02/19/21  1531     History Chief Complaint  Patient presents with  . Wrist Pain    Jocelyn Townsend is a 40 y.o. female with a history of carpal tunnel syndrome, migraines, and prediabetes who presents to the emergency department with complaints of left wrist pain that began yesterday.  Patient states that yesterday she was working on a car and squeezing on a clamp when she felt a pop/crunch in her wrist with onset of pain.  Continues to have pain this morning with some mild swelling which concerned her prompting ER visit.  Pain is worse with movement and trying to pick certain objects up, no alleviating factors.  Denies numbness, tingling, weakness, open wounds, or other areas of injury.  Patient is right-hand dominant.  HPI     Past Medical History:  Diagnosis Date  . Carpal tunnel syndrome   . Intranasal nodule   . Migraines   . Prediabetes   . Vitamin D deficiency     Patient Active Problem List   Diagnosis Date Noted  . Hypersomnia, unspecified 04/19/2020  . Loud snoring 04/19/2020  . 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 Well woman exam 10/20/2015  . 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 TUBAL LIGATION  2009     OB History    Gravida  4   Para  2   Term  2   Preterm      AB  2   Living  2     SAB  1   IAB  1   Ectopic      Multiple      Live Births  2           Family History  Problem Relation Age of Onset  . Breast cancer Maternal Grandmother   . Breast cancer Maternal Great-grandmother   . Sleep apnea Neg Hx     Social History   Tobacco Use  . Smoking status: Never Smoker  . Smokeless tobacco: Never Used  Vaping Use  . Vaping Use: Never used  Substance Use Topics  . Alcohol use: Yes    Alcohol/week: 0.0 standard drinks    Comment: occ  . Drug  use: No    Home Medications Prior to Admission medications   Medication Sig Start Date End Date Taking? Authorizing Provider  B Complex-C (SUPER B COMPLEX PO) Take by mouth.    [provider]  cholecalciferol (VITAMIN D) 1000 UNITS tablet Take 1,000 Units by mouth daily.    [provider]  glucosamine-chondroitin 500-400 MG tablet Take 1 tablet by mouth 3 (three) times daily.    [provider]  ibuprofen (ADVIL) 800 MG tablet Take 1 tablet (800 mg total) by mouth daily. 01/24/21   Rhys Martini, PA-C  Melatonin 2.5 MG CHEW Take one at bedtime po 04/19/20   Dohmeier, Porfirio Mylar, MD  Multiple Vitamin (MULTIVITAMIN) capsule Take by mouth.    [provider]  Olopatadine HCl 0.7 % SOLN Place 1 drop into both eyes in the morning.    [provider]  UNABLE TO FIND Med Name: Move Free Plus MSM-Vit D3 750 mg-100 mg-25 mcg oral tablet    [provider]    Allergies    Nitrofurantoin monohyd macro  Review of Systems   Review of Systems  Constitutional: Negative for chills and fever.  Respiratory: Negative for shortness of breath.   Cardiovascular: Negative for chest pain.  Musculoskeletal: Positive for arthralgias and joint swelling.  Skin: Negative for wound.  Neurological: Negative for syncope, weakness and numbness.    Physical Exam Updated Vital Signs BP 135/80 (BP Location: Right Arm)   Pulse 70   Temp 98.8 F (37.1 C) (Oral)   Resp 19   LMP 01/31/2021   SpO2 99%   Physical Exam Vitals and nursing note reviewed.  Constitutional:      General: She is not in acute distress.    Appearance: Normal appearance. She is not ill-appearing or toxic-appearing.  HENT:     Head: Normocephalic and atraumatic.  Neck:     Comments: No midline tenderness.  Cardiovascular:     Rate and Rhythm: Normal rate.     Pulses:          Radial pulses are 2+ on the right side and 2+ on the left side.  Pulmonary:     Effort: No respiratory  distress.     Breath sounds: Normal breath sounds.  Musculoskeletal:     Cervical back: Normal range of motion and neck supple.     Comments: Upper extremities: No obvious deformity, appreciable  erythema, ecchymosis, warmth, or open wounds.  Mild swelling to the dorsal radial left wrist.  Patient has intact AROM throughout does have some discomfort with active wrist flexion and extension.. Tender to palpation dorsal radial aspect of the left wrist.  No anatomical snuffbox tenderness to palpation.  Otherwise upper extremities are nontender..   Skin:    General: Skin is warm and dry.     Capillary Refill: Capillary refill takes less than 2 seconds.  Neurological:     Mental Status: She is alert.     Comments: Alert. Clear speech. Sensation grossly intact to bilateral upper extremities. 5/5 symmetric grip strength.  Able to perform okay sign, thumbs up, cross second/third digits bilaterally.  Able to ambulatory.   Psychiatric:        Mood and Affect: Mood normal.        Behavior: Behavior normal.     ED Results / Procedures / Treatments   Labs (all labs ordered are listed, but only abnormal results are displayed) Labs Reviewed - No data to display  EKG None  Radiology DG Wrist Complete Left  Result Date: 02/19/2021 CLINICAL DATA:  Wrist pain and swelling since working on a car yesterday. EXAM: LEFT WRIST - COMPLETE 3+ VIEW COMPARISON:  Wrist radiographs 06/20/2012. FINDINGS: No evidence of acute fracture or dislocation. The joint spaces are preserved. There is scapholunate diastasis suggesting chronic ligamentous injury. No focal soft tissue abnormalities are seen. 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 No acute osseous findings. Electronically Signed   By: Carey Bullocks M.D.   On: 02/19/2021 17:11    Procedures Procedures   Medications Ordered in ED Medications - No data to display  ED Course  I have reviewed the triage  vital signs and the nursing notes.  Pertinent labs & imaging results that were available during my care of the patient were reviewed by me and considered in my medical decision making (see chart for details).    MDM Rules/Calculators/A&P                         Patient presents to the ED with complaints of pain to the left wrist after  squeezing a clamp yesterday. Exam without obvious deformity or open wounds. ROM intact, but a bit painful when active. Tender to palpation to dorsal/radial wrist.  No erythema, warmth, or open wounds, range of motion intact, do not suspect septic joint, cellulitis, or infectoius tenosynovitis.Marland Kitchen NVI distally. Xray negative for fracture/dislocation-findings suggestive of chronic scapholunate ligamentous injury per radiology, personally reviewed and interpreted.  No anatomical snuffbox tenderness.  Therapeutic splint provided PRICE and will provide prescription for naproxen with ortho follow up. discussed results, treatment plan, need for follow-up, and return precautions with the patient. Provided opportunity for questions, patient confirmed understanding and are in agreement with plan.   Final Clinical Impression(s) / ED Diagnoses Final diagnoses:  Left wrist pain    Rx / DC Orders ED Discharge Orders         Ordered    naproxen (NAPROSYN) 500 MG tablet  2 times daily PRN        02/19/21 2244           Trystian Crisanto, Pleas Koch, PA-C 02/19/21 2328    Gerhard Munch, MD 02/22/21 1041

## 2021-02-19 NOTE — ED Notes (Signed)
ORTHO TECH PAGED AT THIS TIME

## 2021-02-19 NOTE — ED Notes (Signed)
PT NOT IN ROOM AT THIS TIME

## 2021-02-22 DIAGNOSIS — M25532 Pain in left wrist: Secondary | ICD-10-CM | POA: Diagnosis not present

## 2021-03-07 DIAGNOSIS — M25532 Pain in left wrist: Secondary | ICD-10-CM | POA: Diagnosis not present

## 2021-03-07 DIAGNOSIS — S638X2A Sprain of other part of left wrist and hand, initial encounter: Secondary | ICD-10-CM | POA: Diagnosis not present

## 2021-03-10 ENCOUNTER — Other Ambulatory Visit: Payer: Self-pay

## 2021-03-10 ENCOUNTER — Other Ambulatory Visit (HOSPITAL_COMMUNITY)
Admission: RE | Admit: 2021-03-10 | Discharge: 2021-03-10 | Disposition: A | Discharge status: Home / Self Care | Payer: Medicaid Other | Source: Ambulatory Visit

## 2021-03-10 ENCOUNTER — Ambulatory Visit (INDEPENDENT_AMBULATORY_CARE_PROVIDER_SITE_OTHER): Payer: Medicaid Other

## 2021-03-10 VITALS — BP 119/72 | HR 67 | Ht 65.0 in | Wt 154.8 lb

## 2021-03-10 DIAGNOSIS — Z113 Encounter for screening for infections with a predominantly sexual mode of transmission: Secondary | ICD-10-CM | POA: Insufficient documentation

## 2021-03-10 DIAGNOSIS — R102 Pelvic and perineal pain: Secondary | ICD-10-CM | POA: Diagnosis not present

## 2021-03-10 DIAGNOSIS — Z01419 Encounter for gynecological examination (general) (routine) without abnormal findings: Secondary | ICD-10-CM

## 2021-03-10 DIAGNOSIS — Z124 Encounter for screening for malignant neoplasm of cervix: Secondary | ICD-10-CM

## 2021-03-10 DIAGNOSIS — Z1239 Encounter for other screening for malignant neoplasm of breast: Secondary | ICD-10-CM | POA: Diagnosis not present

## 2021-03-10 NOTE — Progress Notes (Signed)
Pt is in the office for annual. Last pap 09-16-18 Needs mammogram Regular menstrual cycles LMP 02-24-21 Desires std testing Pt reports that she had a CT scan done on 09-15-20 and wants to discuss results.

## 2021-03-10 NOTE — Progress Notes (Signed)
GYNECOLOGY OFFICE VISIT NOTE-WELL WOMAN EXAM  History:   Jocelyn Townsend [id number] here today for annual exam.  She has concerns regarding an MRI from Oct 2021 that was completed due to right groin pain.  However, patient reports pain is still present, but "doesn't bother me as much."  She states she has some pain with certain movements.  She reports periods are normalized as they used to be irregular.  Patient contributes this to recent weight loss.  She states the periods are "heavy" and the first 3 days are "extremely heavy and I have a lot of blood clots."  She states her periods last ~5 days.  She denies abnormal bleeding, vaginal discharge.  She reports some positional pain and/or discomfort during sex.   Birth Control: BTL in 2009  Reproductive Concerns: Partners in last year: Currently Sexually Active, Female, One STD Testing: Desires Full  Breast Exams: She reports she usually checks her breast.  She reports in 2009 she had a scare with a lump, but did not require further evaluation.  She endorses SBA. She endorses family history of breast-maternal grandmother (alive) and maternal aunt.  She denies family history of uterine, cervical, or ovarian cancer  Medical and Nutrition: PCP: Dr. Laruth Bouchard, Oct 2021 Exercise: Reginia Forts, Twice weekly, Cardio, Leg Press, Elliptical  Tobacco/Drugs/Alcohol: Social Alcohol and MJ use.  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 Carpal tunnel syndrome   . Intranasal nodule   . Migraines   . Prediabetes   . Vitamin D deficiency     Past Surgical History:  Procedure Laterality Date  . TUBAL LIGATION  2009    The following portions of the patient's history were reviewed and updated as appropriate: allergies, current medications, past family history, past  medical history, past social history, past surgical history and problem list.   Health Maintenance:  Normal pap and negative HRHPV on Oct 2019.  No mammogram on file: Order placed   Review of Systems:  Pertinent items noted in HPI and remainder of comprehensive ROS otherwise negative.    Objective:    Physical Exam BP 119/72   Pulse 67   Ht 5\' 5"  (1.651 m)   Wt 154 lb 12.8 oz (70.2 kg)   LMP 02/24/2021   BMI 25.76 kg/m  Physical Exam Vitals reviewed. Exam conducted with a chaperone present.  Constitutional:      Appearance: Normal appearance.  HENT:     Head: Normocephalic and atraumatic.  Eyes:     Conjunctiva/sclera: Conjunctivae normal.  Neck:     Thyroid: No thyroid mass, thyromegaly or thyroid tenderness.  Cardiovascular:     Rate and Rhythm: Normal rate and regular rhythm.     Heart sounds: Normal heart sounds.  Pulmonary:     Effort: Pulmonary effort is normal. No respiratory distress.     Breath sounds: Normal breath sounds.  Chest:  Breasts:     Right: No swelling, mass, nipple discharge, skin change or tenderness.     Left: No swelling, mass, nipple discharge, skin change or tenderness.      Comments: CBE completed and normal Abdominal:     General: Bowel sounds are normal.     Palpations: Abdomen is soft.     Tenderness: There is abdominal tenderness in the right lower quadrant.  Genitourinary:    Labia:  Right: No tenderness.        Left: No tenderness.      Vagina: Vaginal discharge (Scant amt beige) present.     Cervix: No cervical motion tenderness, discharge, friability or cervical bleeding.     Uterus: Not enlarged and not tender.      Adnexa:        Right: Tenderness present.      Comments: CV collected Pap collected with brush and spatula. Right ovary with some questionable enlargement, but definitive tenderness with palpation. Left ovary not palpated. Musculoskeletal:        General: Normal range of motion.     Cervical back: Normal  range of motion.  Skin:    General: Skin is warm and dry.  Neurological:     Mental Status: She is alert and oriented to person, place, and time.  Psychiatric:        Mood and Affect: Mood normal.        Behavior: Behavior normal.        Thought Content: Thought content normal.      Labs and Imaging No results found for this or any previous visit (from the past 168 hour(s)). DG Wrist Complete Left  Result Date: 02/19/2021 CLINICAL DATA:  Wrist pain and swelling since working on a car yesterday. EXAM: LEFT WRIST - COMPLETE 3+ VIEW COMPARISON:  Wrist radiographs 06/20/2012. FINDINGS: No evidence of acute fracture or dislocation. The joint spaces are preserved. There is scapholunate diastasis suggesting chronic ligamentous injury. No focal soft tissue abnormalities are seen. 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 No acute osseous findings. Electronically Signed   By: Carey Bullocks M.D.   On: 02/19/2021 17:11     Assessment & Plan:  40 year old Well Woman Exam Pelvic Pain-Right Side BTL     1. Well woman exam with routine gynecological exam -Exam performed and findings discussed. -Educated on AHA exercise recommendations of 30 minutes of moderate to vigorous activity at least 5x/week.  2. Routine cervical smear -Pap collected today -Educated on ASCCP guidelines regarding pap smear evaluation and frequency. -Informed of turnover time and provider/clinic policy on releasing results.  3. Encounter for screening breast examination -CBE completed and normal -Will send order for mammogram -Encouraged to continue SBE with increased breast awareness including examination of breast for skin changes, moles, tenderness, etc.   4. Screening examination for STD (sexually transmitted disease) -Full STD offered and desired -Informed that results will be released to mychart. -Will send treatment as appropriate.  5. Pelvic pain -Right  side ovary pain ilicited during exam. -Will send for pelvic US -Informed that we will manage pain based on results and recommendations.    Routine preventative health maintenance measures emphasized. Please refer to After Visit Summary for other counseling recommendations.   No follow-ups on file.      Cherre Robins, CNM 03/10/2021

## 2021-03-11 LAB — HIV ANTIBODY (ROUTINE TESTING W REFLEX): HIV Screen 4th Generation wRfx: NONREACTIVE

## 2021-03-11 LAB — RPR: RPR Ser Ql: NONREACTIVE

## 2021-03-11 LAB — HEPATITIS B SURFACE ANTIGEN: Hepatitis B Surface Ag: NEGATIVE

## 2021-03-11 LAB — HEPATITIS C ANTIBODY: Hep C Virus Ab: <0.1 s/co ratio (ref 0.0–0.9)

## 2021-03-13 LAB — CERVICOVAGINAL ANCILLARY ONLY
Bacterial Vaginitis (gardnerella): NEGATIVE
Candida Glabrata: NEGATIVE
Candida Vaginitis: NEGATIVE
Chlamydia: NEGATIVE
Comment: NEGATIVE
Comment: NEGATIVE
Comment: NEGATIVE
Comment: NEGATIVE
Comment: NEGATIVE
Comment: NORMAL
Neisseria Gonorrhea: NEGATIVE
Trichomonas: NEGATIVE

## 2021-03-15 DIAGNOSIS — M25532 Pain in left wrist: Secondary | ICD-10-CM | POA: Diagnosis not present

## 2021-03-15 LAB — CYTOLOGY - PAP
Comment: NEGATIVE
Diagnosis: NEGATIVE
Diagnosis: REACTIVE
High risk HPV: NEGATIVE

## 2021-03-28 DIAGNOSIS — S638X2A Sprain of other part of left wrist and hand, initial encounter: Secondary | ICD-10-CM | POA: Diagnosis not present

## 2021-03-28 DIAGNOSIS — M25532 Pain in left wrist: Secondary | ICD-10-CM | POA: Diagnosis not present

## 2021-03-31 ENCOUNTER — Other Ambulatory Visit: Payer: Self-pay

## 2021-03-31 ENCOUNTER — Ambulatory Visit
Admission: RE | Admit: 2021-03-31 | Discharge: 2021-03-31 | Disposition: A | Discharge status: Home / Self Care | Payer: Medicaid Other | Source: Ambulatory Visit

## 2021-03-31 DIAGNOSIS — Z1239 Encounter for other screening for malignant neoplasm of breast: Secondary | ICD-10-CM

## 2021-03-31 DIAGNOSIS — Z01419 Encounter for gynecological examination (general) (routine) without abnormal findings: Secondary | ICD-10-CM

## 2021-04-10 ENCOUNTER — Ambulatory Visit (HOSPITAL_COMMUNITY)
Admission: RE | Admit: 2021-04-10 | Discharge: 2021-04-10 | Disposition: A | Discharge status: Home / Self Care | Payer: Medicaid Other | Source: Ambulatory Visit

## 2021-04-10 ENCOUNTER — Other Ambulatory Visit: Payer: Self-pay

## 2021-04-10 DIAGNOSIS — R102 Pelvic and perineal pain: Secondary | ICD-10-CM | POA: Insufficient documentation

## 2021-04-12 DIAGNOSIS — M25532 Pain in left wrist: Secondary | ICD-10-CM | POA: Diagnosis not present

## 2021-04-12 DIAGNOSIS — M24132 Other articular cartilage disorders, left wrist: Secondary | ICD-10-CM | POA: Diagnosis not present

## 2021-04-12 DIAGNOSIS — G8918 Other acute postprocedural pain: Secondary | ICD-10-CM | POA: Diagnosis not present

## 2021-04-12 DIAGNOSIS — S63592A Other specified sprain of left wrist, initial encounter: Secondary | ICD-10-CM | POA: Diagnosis not present

## 2021-04-18 DIAGNOSIS — S638X2A Sprain of other part of left wrist and hand, initial encounter: Secondary | ICD-10-CM | POA: Diagnosis not present

## 2021-04-25 DIAGNOSIS — S638X2D Sprain of other part of left wrist and hand, subsequent encounter: Secondary | ICD-10-CM | POA: Diagnosis not present

## 2021-06-07 DIAGNOSIS — T8484XA Pain due to internal orthopedic prosthetic devices, implants and grafts, initial encounter: Secondary | ICD-10-CM | POA: Diagnosis not present

## 2021-06-07 DIAGNOSIS — M25532 Pain in left wrist: Secondary | ICD-10-CM | POA: Diagnosis not present

## 2021-06-07 DIAGNOSIS — Z472 Encounter for removal of internal fixation device: Secondary | ICD-10-CM | POA: Diagnosis not present

## 2021-07-17 DIAGNOSIS — M25532 Pain in left wrist: Secondary | ICD-10-CM | POA: Diagnosis not present

## 2021-07-17 DIAGNOSIS — S638X2D Sprain of other part of left wrist and hand, subsequent encounter: Secondary | ICD-10-CM | POA: Diagnosis not present

## 2021-08-01 DIAGNOSIS — Z20822 Contact with and (suspected) exposure to covid-19: Secondary | ICD-10-CM | POA: Diagnosis not present

## 2021-08-08 DIAGNOSIS — S638X2D Sprain of other part of left wrist and hand, subsequent encounter: Secondary | ICD-10-CM | POA: Diagnosis not present

## 2021-08-08 DIAGNOSIS — M25532 Pain in left wrist: Secondary | ICD-10-CM | POA: Diagnosis not present

## 2021-08-15 DIAGNOSIS — M25532 Pain in left wrist: Secondary | ICD-10-CM | POA: Diagnosis not present

## 2021-08-15 DIAGNOSIS — S638X2D Sprain of other part of left wrist and hand, subsequent encounter: Secondary | ICD-10-CM | POA: Diagnosis not present

## 2021-08-22 DIAGNOSIS — M25532 Pain in left wrist: Secondary | ICD-10-CM | POA: Diagnosis not present

## 2021-08-22 DIAGNOSIS — S638X2D Sprain of other part of left wrist and hand, subsequent encounter: Secondary | ICD-10-CM | POA: Diagnosis not present

## 2021-11-07 DIAGNOSIS — R202 Paresthesia of skin: Secondary | ICD-10-CM | POA: Diagnosis not present

## 2021-11-07 DIAGNOSIS — G8929 Other chronic pain: Secondary | ICD-10-CM | POA: Diagnosis not present

## 2021-11-07 DIAGNOSIS — M25521 Pain in right elbow: Secondary | ICD-10-CM | POA: Diagnosis not present

## 2021-11-10 DIAGNOSIS — M542 Cervicalgia: Secondary | ICD-10-CM | POA: Diagnosis not present

## 2021-11-10 DIAGNOSIS — M25521 Pain in right elbow: Secondary | ICD-10-CM | POA: Diagnosis not present

## 2021-11-30 ENCOUNTER — Other Ambulatory Visit: Payer: Self-pay

## 2021-11-30 ENCOUNTER — Ambulatory Visit: Payer: Medicaid Other | Attending: Orthopedic Surgery

## 2021-11-30 DIAGNOSIS — M6281 Muscle weakness (generalized): Secondary | ICD-10-CM | POA: Diagnosis not present

## 2021-11-30 DIAGNOSIS — M5412 Radiculopathy, cervical region: Secondary | ICD-10-CM | POA: Diagnosis present

## 2021-11-30 DIAGNOSIS — R293 Abnormal posture: Secondary | ICD-10-CM | POA: Diagnosis not present

## 2021-11-30 DIAGNOSIS — M79601 Pain in right arm: Secondary | ICD-10-CM | POA: Insufficient documentation

## 2021-11-30 NOTE — Therapy (Signed)
[facility] [address] [place], [place], [address] Phone: [phone]   Fax:  [phone]  Physical Therapy Evaluation  Patient Details  Name: Jocelyn Townsend MRN: [id number] Date of Birth: 06/23/1981 Referring Provider (PT): Margarita Rana, MD   Encounter Date: 11/30/2021   PT End of Session - 11/30/21 1108     Visit Number 1    Date for PT Re-Evaluation 01/25/22    Authorization Type [employer] Medicaid- 27 visit limit    PT Start Time 1017    PT Stop Time 1059    PT Time Calculation (min) 42 min    Activity Tolerance Patient tolerated treatment well             Past Medical History:  Diagnosis Date   Carpal tunnel syndrome    Intranasal nodule    Migraines    Prediabetes    Vitamin D deficiency     Past Surgical History:  Procedure Laterality Date   TUBAL LIGATION  2009    There were no vitals filed for this visit.    Subjective Assessment - 11/30/21 1019     Subjective Pt reports onset of Rt elbow pain 1 year ago.  Pt had a fall in 2021 and fell/landed on the elbow.  Now pt is experiencing numbness and tingling in the right hand and wrist.  MD suspects that symptoms are coming from her neck. Pt took a steroid dose pack and this significantly helped the pain overall.    Pertinent History migraines    Diagnostic tests x-ray of elbow- no acute fracture    Patient Stated Goals reduce Rt UE pain, improve Rt UE strength    Currently in Pain? Yes    Pain Score 1    prior to steroid medication: 10/10.  Not 1/10   Pain Location Elbow    Pain Orientation Right    Pain Descriptors / Indicators Shooting    Pain Type Chronic pain    Pain Radiating Towards fingers and hand    Pain Onset More than a month ago    Pain Frequency Intermittent    Aggravating Factors  hitting elbow on hard object, sitting upright, reading book, pressure on elbow, lifting (notices weakenss    Pain Relieving Factors laying on stomach and  letting arm drop down, stretching my elbow/arm, massage to elbow                [facility] PT Assessment - 11/30/21 0001       Assessment   Medical Diagnosis cervicalgia, Rt cervical radiculopathy    Referring Provider (PT) Margarita Rana, MD    Onset Date/Surgical Date 07/21/20    Hand Dominance Right    Next MD Visit 2 weeks      Precautions   Precautions None      Restrictions   Weight Bearing Restrictions No      Balance Screen   Has the patient fallen in the past 6 months No    Has the patient had a decrease in activity level because of a fear of falling?  No    Is the patient reluctant to leave their home because of a fear of falling?  No      Home Nurse, mental health Private residence    Living Arrangements Spouse/significant other;Children      Prior Function   Level of Independence Independent    Vocation Full time employment    Vocation Requirements cleans houses  and businesses    Leisure exercise, walking, reading books      Cognition   Overall Cognitive Status Within Functional Limits for tasks assessed      Observation/Other Assessments   Focus on Therapeutic Outcomes (FOTO)  65 (goal is 69)      Posture/Postural Control   Posture/Postural Control Postural limitations    Postural Limitations Forward head;Rounded Shoulders      ROM / Strength   AROM / PROM / Strength AROM;PROM;Strength      AROM   Overall AROM  Within functional limits for tasks performed    Overall AROM Comments cervical ROM is full with Rt sided neck pain and stiffness with flexion, Lt rotation and Lt sidebending.  Rt=Lt UE A/ROM with Rt shoulder stiffness at end range with overpressure      PROM   Overall PROM  Within functional limits for tasks performed      Strength   Overall Strength Deficits    Overall Strength Comments Lt UE: 5/5    Strength Assessment Site Shoulder;Hand    Right/Left Shoulder Right    Right Shoulder Flexion 4/5    Right Shoulder  Extension 4/5    Right Shoulder ABduction 4/5    Right/Left hand Right;Left    Right Hand Grip (lbs) 47    Left Hand Grip (lbs) 61      Palpation   Spinal mobility reduced PA segmental      Transfers   Transfers Independent with all Transfers      Ambulation/Gait   Ambulation/Gait Yes    Ambulation/Gait Assistance 7: Independent                        Objective measurements completed on examination: See above findings.                PT Education - 11/30/21 1050     Education Details Access Code: [email or web address]    Person(s) Educated Patient    Methods Explanation;Demonstration;Handout    Comprehension Verbalized understanding;Returned demonstration              PT Short Term Goals - 11/30/21 1252       PT SHORT TERM GOAL #1   Title pt to be I with initial HEP    Baseline no previous HEP    Time 4    Period Weeks    Status New    Target Date 12/28/21      PT SHORT TERM GOAL #2   Title report a 30% reduction in Rt UE radiculopathy with reaching, lifting and sitting    Baseline --    Time 4    Period Weeks    Status New    Target Date 12/28/21      PT SHORT TERM GOAL #3   Title sit with neutral seated posture and report corrections in posture and mechanics with work tasks to reduce radiculopathy    Time 4    Period Weeks    Status New    Target Date 12/28/21               PT Long Term Goals - 11/30/21 1254       PT LONG TERM GOAL #1   Title be independent in advanced HEP    Baseline --    Time 8    Status New    Target Date 01/25/22      PT LONG TERM GOAL #2   Title report >  or = to 60% reduction in Rt UE radiculopathy with reaching and sitting    Baseline --    Time 8    Period Weeks    Status New    Target Date 01/25/22      PT LONG TERM GOAL #3   Title improve FOTO to > or = to 69    Baseline 65    Time 8    Period Weeks    Status New    Target Date 01/25/22      PT LONG TERM GOAL #4   Title improve Rt  grip strength to > or = to 65# to improve lifting and carrying items    Baseline 47#    Time 8    Period Weeks    Status New    Target Date 01/25/22      PT LONG TERM GOAL #5   Title demonstrate > or = to 4+/5 Rt shoulder and elbow strength to improve functional UE endurance    Time 8    Period Weeks    Status New    Target Date 01/25/22                    Plan - 11/30/21 1119     Clinical Impression Statement Pt presents to PT with Rt elbow pain and UE radiculopathy that began 2 years ago without cause.  All imaging for elbow is negative and MD now suspects the UE symptoms to be cervical radiculopathy.  Pt just completed a dose pack of steroids and pain has been significantly reduced since that time.  Pt also reports Rt UE strength deficits as noticed with lifting and carrying with the Rt UE.  Pt describes burning from Rt elbow into Rt hand and fingers.  Pain increases when hitting the elbow on a hard surface, lifting with the Rt UE and when sitting long periods.  Pain reduces with prone position with Lt arm dangling or with extending Rt elbow and wrist out to the side (neural tension position).  Pt demonstrates full cervical A/ROM with Rt sided stiffness and pain reported with flexion and Lt sidebending and rotation.  Rt=Lt UE A/ROM with stiffness reported at end range Rt shoulder ROM.  Rt shoulder, elbow and grip strength are limited vs the Lt.  Pt with reduced mobility in the cervical spinal segments with pain C4-6.  Pt with tension and trigger points in bil upper traps and Rt>Lt cervical paraspinals.  No palpable tenderness at the elbow, forearm or hand.  Pt with poor seated posture and scapular elevation at times.  Pt will benefit from skilled PT to address posture, alignment and Rt UE radiculopathy.    Personal Factors and Comorbidities Comorbidity 1    Comorbidities Lt wrist surgery    Examination-Activity Limitations Sit;Lift    Examination-Participation Restrictions  Driving;Laundry;Meal Prep;Shop    Stability/Clinical Decision Making Stable/Uncomplicated    Clinical Decision Making Low    Rehab Potential Good    PT Frequency 2x / week    PT Duration 8 weeks    PT Treatment/Interventions ADLs/Self Care Home Management;Moist Heat;Cryotherapy;Electrical Stimulation;Traction;Therapeutic activities;Therapeutic exercise;Neuromuscular re-education;Taping;Manual techniques;Passive range of motion;Joint Manipulations;Spinal Manipulations    PT Next Visit Plan DN to cervical/upper traps, try cervical traction, postural strength, flexibility    PT Home Exercise Plan Access Code: [email or web address]             Patient will benefit from skilled therapeutic intervention in order to improve the following deficits and impairments:  Decreased activity tolerance, Impaired flexibility, Postural dysfunction, Pain, Impaired UE functional use, Improper body mechanics, Hypomobility, Increased muscle spasms  Visit Diagnosis: Pain in right arm - Plan: PT plan of care cert/re-cert  Muscle weakness (generalized) - Plan: PT plan of care cert/re-cert  Abnormal posture - Plan: PT plan of care cert/re-cert     Problem List Patient Active Problem List   Diagnosis Date Noted   Hypersomnia, unspecified 04/19/2020   Loud snoring 04/19/2020   Sleep disorder, circadian, shift work type 04/19/2020   Well woman exam 10/20/2015   CIN III with severe dysplasia 08/19/2014   Lorrene ReidKelly Jerri Glauser, PT 11/30/21 1:03 PM  [facility] [address] [place], [place], [address] Phone: [phone]   Fax:  [phone]  Name: Rexene AgentLlaya Reicher MRN: [id number] Date of Birth: [birth year]

## 2021-11-30 NOTE — Patient Instructions (Signed)
Access Code: [email or web address] URL: [email or web address] Date: 11/30/2021 Prepared by: Claiborne Billings  Exercises Seated Cervical Flexion AROM - 3 x daily - 7 x weekly - 1 sets - 3 reps - 20 hold Seated Cervical Sidebending AROM - 3 x daily - 7 x weekly - 1 sets - 3 reps - 20 hold Seated Cervical Rotation AROM - 3 x daily - 7 x weekly - 1 sets - 3 reps - 20 hold Seated Correct Posture - 1 x daily - 7 x weekly - 3 sets - 10 reps Standing Median Nerve Glide - 3 x daily - 7 x weekly - 1 sets - 10 reps Shoulder External Rotation and Scapular Retraction with Resistance - 2 x daily - 7 x weekly - 2 sets - 10 reps

## 2021-12-12 DIAGNOSIS — Z13220 Encounter for screening for lipoid disorders: Secondary | ICD-10-CM | POA: Diagnosis not present

## 2021-12-12 DIAGNOSIS — Z0000 Encounter for general adult medical examination without abnormal findings: Secondary | ICD-10-CM | POA: Diagnosis not present

## 2021-12-12 DIAGNOSIS — E559 Vitamin D deficiency, unspecified: Secondary | ICD-10-CM | POA: Diagnosis not present

## 2021-12-12 DIAGNOSIS — R7303 Prediabetes: Secondary | ICD-10-CM | POA: Diagnosis not present

## 2021-12-13 ENCOUNTER — Other Ambulatory Visit: Payer: Self-pay

## 2021-12-13 ENCOUNTER — Ambulatory Visit: Payer: Medicaid Other

## 2021-12-13 DIAGNOSIS — M79601 Pain in right arm: Secondary | ICD-10-CM

## 2021-12-13 DIAGNOSIS — M5412 Radiculopathy, cervical region: Secondary | ICD-10-CM | POA: Diagnosis not present

## 2021-12-13 DIAGNOSIS — M6281 Muscle weakness (generalized): Secondary | ICD-10-CM

## 2021-12-13 DIAGNOSIS — R293 Abnormal posture: Secondary | ICD-10-CM

## 2021-12-13 NOTE — Patient Instructions (Addendum)
Added scapular stabilization to HEP Access Code: [email or web address] URL: [email or web address] Date: 12/13/2021 Prepared by: Mikey Kirschner  Exercises Seated Cervical Flexion AROM - 3 x daily - 7 x weekly - 1 sets - 3 reps - 20 hold Seated Cervical Sidebending AROM - 3 x daily - 7 x weekly - 1 sets - 3 reps - 20 hold Seated Cervical Rotation AROM - 3 x daily - 7 x weekly - 1 sets - 3 reps - 20 hold Seated Correct Posture - 1 x daily - 7 x weekly - 3 sets - 10 reps Standing Median Nerve Glide - 3 x daily - 7 x weekly - 1 sets - 10 reps Shoulder External Rotation and Scapular Retraction with Resistance - 2 x daily - 7 x weekly - 2 sets - 10 reps Shoulder Extension with Resistance - 2 x daily - 7 x weekly - 2 sets - 10 reps Standing Bilateral Low Shoulder Row with Anchored Resistance - 2 x daily - 7 x weekly - 2 sets - 10 reps Standing Shoulder Horizontal Abduction with Resistance - 2 x daily - 7 x weekly - 2 sets - 10 reps Prone Shoulder Extension - Single Arm - 2 x daily - 7 x weekly - 2 sets - 10 reps Prone Shoulder Row - 2 x daily - 7 x weekly - 2 sets - 10 reps Prone Single Arm Shoulder Horizontal Abduction with Scapular Retraction and Palm Down - 2 x daily - 7 x weekly - 2 sets - 10 reps Sidelying Shoulder External Rotation - 2 x daily - 7 x weekly - 2 sets - 10 reps Single Arm Serratus Punches in Supine with Dumbbell - 2 x daily - 7 x weekly - 2 sets - 10 reps

## 2021-12-13 NOTE — Therapy (Signed)
[facility] [address] [place], [place], [address] Phone: [phone]   Fax:  [phone]  Physical Therapy Treatment  Patient Details  Name: Jocelyn Townsend MRN: [id number] Date of Birth: May 02, 1981 Referring Provider (PT): Margarita Rana, MD   Encounter Date: 12/13/2021   PT End of Session - 12/13/21 0931     Visit Number 2    Date for PT Re-Evaluation 01/25/22    Authorization Type UHC Medicaid- 27 visit limit    PT Start Time 0845    PT Stop Time 0930    PT Time Calculation (min) 45 min    Activity Tolerance Patient tolerated treatment well    Behavior During Therapy [facility] for tasks assessed/performed             Past Medical History:  Diagnosis Date   Carpal tunnel syndrome    Intranasal nodule    Migraines    Prediabetes    Vitamin D deficiency     Past Surgical History:  Procedure Laterality Date   TUBAL LIGATION  2009    There were no vitals filed for this visit.   Subjective Assessment - 12/13/21 0850     Subjective Patient reports she is "fine".  She rates her pain at 1/10.  We discuss her reports of her arm feeling heavy and if she has any pain in the shoulder.  She states the whole arm kind of feels painful and heavy at times.    Pertinent History migraines    Diagnostic tests x-ray of elbow- no acute fracture    Patient Stated Goals reduce Rt UE pain, improve Rt UE strength    Currently in Pain? Yes    Pain Score 1     Pain Location Elbow    Pain Orientation Right    Pain Descriptors / Indicators Aching    Pain Type Chronic pain    Pain Onset More than a month ago    Pain Frequency Intermittent                               [facility] Adult PT Treatment/Exercise - 12/13/21 0001       Exercises   Exercises Shoulder;Neck      Neck Exercises: Machines for Strengthening   Nustep L4 x 5 min emphasizing arm motion      Neck Exercises: Theraband   Scapula Retraction 20  reps;Red    Shoulder Extension 20 reps;Red    Shoulder External Rotation 20 reps;Red    Horizontal ABduction 20 reps;Red      Shoulder Exercises: Supine   Protraction Strengthening;Right;20 reps;Weights    Protraction Weight (lbs) 2      Shoulder Exercises: Prone   Retraction Strengthening;Right;20 reps;Weights    Retraction Weight (lbs) 2    Extension Strengthening;Right;20 reps;Weights    Extension Weight (lbs) 2    External Rotation Strengthening;Right;20 reps;Weights    External Rotation Weight (lbs) 2    Horizontal ABduction 1 Strengthening;Right;20 reps;Weights    Horizontal ABduction 1 Weight (lbs) 2      Shoulder Exercises: Sidelying   External Rotation Strengthening;Right;20 reps;Weights    External Rotation Weight (lbs) 2                     PT Education - 12/13/21 0930     Education Details Educated patient on new scapular stabilziation exercises    Person(s) Educated Patient  Methods Explanation;Demonstration;Verbal cues;Handout    Comprehension Verbalized understanding;Returned demonstration;Verbal cues required              PT Short Term Goals - 11/30/21 1252       PT SHORT TERM GOAL #1   Title pt to be I with initial HEP    Baseline no previous HEP    Time 4    Period Weeks    Status New    Target Date 12/28/21      PT SHORT TERM GOAL #2   Title report a 30% reduction in Rt UE radiculopathy with reaching, lifting and sitting    Baseline --    Time 4    Period Weeks    Status New    Target Date 12/28/21      PT SHORT TERM GOAL #3   Title sit with neutral seated posture and report corrections in posture and mechanics with work tasks to reduce radiculopathy    Time 4    Period Weeks    Status New    Target Date 12/28/21               PT Long Term Goals - 11/30/21 1254       PT LONG TERM GOAL #1   Title be independent in advanced HEP    Baseline --    Time 8    Status New    Target Date 01/25/22      PT LONG TERM  GOAL #2   Title report > or = to 60% reduction in Rt UE radiculopathy with reaching and sitting    Baseline --    Time 8    Period Weeks    Status New    Target Date 01/25/22      PT LONG TERM GOAL #3   Title improve FOTO to > or = to 69    Baseline 65    Time 8    Period Weeks    Status New    Target Date 01/25/22      PT LONG TERM GOAL #4   Title improve Rt grip strength to > or = to 65# to improve lifting and carrying items    Baseline 47#    Time 8    Period Weeks    Status New    Target Date 01/25/22      PT LONG TERM GOAL #5   Title demonstrate > or = to 4+/5 Rt shoulder and elbow strength to improve functional UE endurance    Time 8    Period Weeks    Status New    Target Date 01/25/22                   Plan - 12/13/21 0931     Clinical Impression Statement Patient was able to tolerate new exercises with no increase in pain but did have some mild tingling with arm in prone dependent position off edge of table.  She is very compliant with her HEP.  She would benefit from continued skilled PT for nerve gliding and shoulder stabilization along with elbow ROM and strengthening.    Personal Factors and Comorbidities Comorbidity 1    Comorbidities Lt wrist surgery    Examination-Activity Limitations Sit;Lift    Examination-Participation Restrictions Driving;Laundry;Meal Prep;Shop    Stability/Clinical Decision Making Stable/Uncomplicated    Clinical Decision Making Low    Rehab Potential Good    PT Frequency 2x / week    PT Duration  8 weeks    PT Treatment/Interventions ADLs/Self Care Home Management;Moist Heat;Cryotherapy;Electrical Stimulation;Traction;Therapeutic activities;Therapeutic exercise;Neuromuscular re-education;Taping;Manual techniques;Passive range of motion;Joint Manipulations;Spinal Manipulations    PT Next Visit Plan DN to cervical/upper traps, try cervical traction, postural strength, flexibility, review new shoulder and postural exercises.     PT Home Exercise Plan Access Code: [email or web address]             Patient will benefit from skilled therapeutic intervention in order to improve the following deficits and impairments:  Decreased activity tolerance, Impaired flexibility, Postural dysfunction, Pain, Impaired UE functional use, Improper body mechanics, Hypomobility, Increased muscle spasms  Visit Diagnosis: Pain in right arm  Muscle weakness (generalized)  Abnormal posture     Problem List Patient Active Problem List   Diagnosis Date Noted   Hypersomnia, unspecified 04/19/2020   Loud snoring 04/19/2020   Sleep disorder, circadian, shift work type 04/19/2020   Well woman exam 10/20/2015   CIN III with severe dysplasia 08/19/2014    Victorino DikeJennifer B. Lekesha Claw, PT 01/25/239:43 AM   [facility] [address] [place], [place], [address] Phone: [phone]   Fax:  [phone]  Name: [name] MRN: [id number] Date of Birth: 20-May-1981

## 2021-12-15 ENCOUNTER — Ambulatory Visit: Payer: Medicaid Other | Admitting: Physical Therapy

## 2021-12-15 ENCOUNTER — Other Ambulatory Visit: Payer: Self-pay

## 2021-12-15 DIAGNOSIS — M5412 Radiculopathy, cervical region: Secondary | ICD-10-CM | POA: Diagnosis not present

## 2021-12-15 DIAGNOSIS — M79601 Pain in right arm: Secondary | ICD-10-CM

## 2021-12-15 DIAGNOSIS — M6281 Muscle weakness (generalized): Secondary | ICD-10-CM

## 2021-12-15 DIAGNOSIS — R293 Abnormal posture: Secondary | ICD-10-CM

## 2021-12-15 NOTE — Therapy (Signed)
[facility] [address] [place], [place], [address] Phone: [phone]   Fax:  [phone]  Physical Therapy Treatment  Patient Details  Name: Jocelyn Townsend MRN: [id number] Date of Birth: 1981/09/02 Referring Provider (PT): Margarita Rana, MD   Encounter Date: 12/15/2021   PT End of Session - 12/15/21 1149     Visit Number 3    Date for PT Re-Evaluation 01/25/22    Authorization Type UHC Medicaid- 27 visit limit    PT Start Time 0802    PT Stop Time 0844    PT Time Calculation (min) 42 min    Activity Tolerance Patient tolerated treatment well             Past Medical History:  Diagnosis Date   Carpal tunnel syndrome    Intranasal nodule    Migraines    Prediabetes    Vitamin D deficiency     Past Surgical History:  Procedure Laterality Date   TUBAL LIGATION  2009    There were no vitals filed for this visit.   Subjective Assessment - 12/15/21 0805     Subjective Had numbness/tingling earlier this morning but not now.  Right lateral shoulder pain. Did well with last treatment session.    Pertinent History left wrist surgery last Summer with limit wrist ROM    Currently in Pain? Yes    Pain Score 1     Pain Location Shoulder    Pain Orientation Right    Pain Type Chronic pain                               [facility] Adult PT Treatment/Exercise - 12/15/21 0001       Neck Exercises: Machines for Strengthening   Nustep L4 x 5 min emphasizing arm motion    Other Machines for Strengthening lat bar 25# standing 15x      Neck Exercises: Standing   Other Standing Exercises wall push ups 10x    Other Standing Exercises discussed neural gliding goal, frequency and intensity      Shoulder Exercises: Prone   Retraction Strengthening;Right;20 reps;Weights    Retraction Weight (lbs) 2    Extension Strengthening;Right;20 reps;Weights    Extension Weight (lbs) 2    External Rotation  Strengthening;Right;20 reps;Weights    External Rotation Weight (lbs) 2      Shoulder Exercises: Sidelying   External Rotation Strengthening;Right;20 reps;Weights    External Rotation Weight (lbs) 2      Shoulder Exercises: Standing   Other Standing Exercises red band openers 15x horizontal abduction      Shoulder Exercises: [employer] consultant lat pull downs 25 # 15x      Manual Therapy   Joint Mobilization PA C4-C7, lateral glides C4-C7    Manual Traction 10 sec holds 10x    Neural Stretch right upper extremity gliding                       PT Short Term Goals - 11/30/21 1252       PT SHORT TERM GOAL #1   Title pt to be I with initial HEP    Baseline no previous HEP    Time 4    Period Weeks    Status New    Target Date 12/28/21      PT SHORT TERM GOAL #2  Title report a 30% reduction in Rt UE radiculopathy with reaching, lifting and sitting    Baseline --    Time 4    Period Weeks    Status New    Target Date 12/28/21      PT SHORT TERM GOAL #3   Title sit with neutral seated posture and report corrections in posture and mechanics with work tasks to reduce radiculopathy    Time 4    Period Weeks    Status New    Target Date 12/28/21               PT Long Term Goals - 11/30/21 1254       PT LONG TERM GOAL #1   Title be independent in advanced HEP    Baseline --    Time 8    Status New    Target Date 01/25/22      PT LONG TERM GOAL #2   Title report > or = to 60% reduction in Rt UE radiculopathy with reaching and sitting    Baseline --    Time 8    Period Weeks    Status New    Target Date 01/25/22      PT LONG TERM GOAL #3   Title improve FOTO to > or = to 69    Baseline 65    Time 8    Period Weeks    Status New    Target Date 01/25/22      PT LONG TERM GOAL #4   Title improve Rt grip strength to > or = to 65# to improve lifting and carrying items    Baseline 47#    Time 8    Period Weeks     Status New    Target Date 01/25/22      PT LONG TERM GOAL #5   Title demonstrate > or = to 4+/5 Rt shoulder and elbow strength to improve functional UE endurance    Time 8    Period Weeks    Status New    Target Date 01/25/22                   Plan - 12/15/21 1155     Clinical Impression Statement Variable response to manual cervical traction with relief of lateral shoulder pain but production of some distal numbness/tingling.  Median nerve tension does not produce symptoms but ulnar nerve moderately reactive.  Discussed sidebending to the right with ulnar nerve tensioning for less intensity.   Good response to scapula muscle strengthening with minimal symptom production.  Therapist monitoring response and modifying as appropriate.    Personal Factors and Comorbidities Comorbidity 1    Comorbidities Lt wrist surgery    Rehab Potential Good    PT Frequency 2x / week    PT Duration 8 weeks    PT Treatment/Interventions ADLs/Self Care Home Management;Moist Heat;Cryotherapy;Electrical Stimulation;Traction;Therapeutic activities;Therapeutic exercise;Neuromuscular re-education;Taping;Manual techniques;Passive range of motion;Joint Manipulations;Spinal Manipulations    PT Next Visit Plan DN to cervical/upper traps, postural strength, flexibility, add thoracic mobility ex: foam roll child pose, thread the needle, open books; ulnar nerve gliding with sidebend towards affected side; lat bar; try counter push ups    PT Home Exercise Plan Access Code: [email or web address]             Patient will benefit from skilled therapeutic intervention in order to improve the following deficits and impairments:  Decreased activity tolerance, Impaired flexibility, Postural dysfunction, Pain, Impaired UE  functional use, Improper body mechanics, Hypomobility, Increased muscle spasms  Visit Diagnosis: Pain in right arm  Muscle weakness (generalized)  Abnormal posture     Problem List Patient Active  Problem List   Diagnosis Date Noted   Hypersomnia, unspecified 04/19/2020   Loud snoring 04/19/2020   Sleep disorder, circadian, shift work type 04/19/2020   Well woman exam 10/20/2015   CIN III with severe dysplasia 08/19/2014   Lavinia Sharps, PT 12/15/21 12:06 PM Phone: [phone] Fax: [phone]   Vivien Presto, PT 12/15/2021, 12:06 PM  [facility] @ [facility] [address] [place], [place], [address] Phone: [phone]   Fax:  [phone]  Name: Makaria Poarch MRN: [id number] Date of Birth: 1981-04-25

## 2021-12-19 ENCOUNTER — Ambulatory Visit: Payer: Medicaid Other | Admitting: Physical Therapy

## 2021-12-21 ENCOUNTER — Encounter: Payer: Medicaid Other | Admitting: Physical Therapy

## 2021-12-25 DIAGNOSIS — M25521 Pain in right elbow: Secondary | ICD-10-CM | POA: Diagnosis not present

## 2021-12-26 ENCOUNTER — Ambulatory Visit: Payer: Medicaid Other | Attending: Orthopedic Surgery

## 2021-12-26 ENCOUNTER — Other Ambulatory Visit: Payer: Self-pay

## 2021-12-26 DIAGNOSIS — M79601 Pain in right arm: Secondary | ICD-10-CM | POA: Diagnosis not present

## 2021-12-26 DIAGNOSIS — M6281 Muscle weakness (generalized): Secondary | ICD-10-CM | POA: Diagnosis present

## 2021-12-26 DIAGNOSIS — R293 Abnormal posture: Secondary | ICD-10-CM | POA: Diagnosis present

## 2021-12-26 NOTE — Therapy (Signed)
[facility] [address] [place], [place], [address] Phone: [phone]   Fax:  [phone]  Physical Therapy Treatment  Patient Details  Name: Jocelyn Townsend MRN: [id number] Date of Birth: 1981/10/29 Referring Provider (PT): Margarita Rana, MD   Encounter Date: 12/26/2021   PT End of Session - 12/26/21 1052     Visit Number 4    Date for PT Re-Evaluation 01/25/22    Authorization Type UHC Medicaid- 27 visit limit    PT Start Time 1015    PT Stop Time 1053    PT Time Calculation (min) 38 min    Activity Tolerance Patient tolerated treatment well    Behavior During Therapy [facility] for tasks assessed/performed             Past Medical History:  Diagnosis Date   Carpal tunnel syndrome    Intranasal nodule    Migraines    Prediabetes    Vitamin D deficiency     Past Surgical History:  Procedure Laterality Date   TUBAL LIGATION  2009    There were no vitals filed for this visit.   Subjective Assessment - 12/26/21 1024     Subjective I am feeling about the same.  I'm doing the exercises.  I feel improvement with nerve glides.    Pertinent History left wrist surgery last Summer with limit wrist ROM    Currently in Pain? Yes    Pain Score 0-No pain   not a lot of pain   Pain Location Shoulder    Pain Descriptors / Indicators Aching                               [facility] Adult PT Treatment/Exercise - 12/26/21 0001       Neck Exercises: Machines for Strengthening   Nustep L4 x 4 min emphasizing arm motion      Neck Exercises: Standing   Other Standing Exercises discussed neural gliding goal, frequency and intensity      Shoulder Exercises: Sidelying   External Rotation Strengthening;Right;20 reps;Weights    External Rotation Weight (lbs) 2    Other Sidelying Exercises open book x 10 each      Manual Therapy   Manual Therapy Soft tissue mobilization;Myofascial release    Manual therapy  comments skilled palpation and monitoring of pt throughout TPDN    Soft tissue mobilization elongation and release to bil neck and upper traps              Trigger Point Dry Needling - 12/26/21 0001     Consent Given? Yes    Education Handout Provided Yes    Muscles Treated Head and Neck Upper trapezius;Oblique capitus;Cervical multifidi    Upper Trapezius Response Twitch reponse elicited;Palpable increased muscle length    Oblique Capitus Response Twitch response elicited;Palpable increased muscle length    Cervical multifidi Response Twitch reponse elicited;Palpable increased muscle length                   PT Education - 12/26/21 1031     Education Details DN info    Person(s) Educated Patient    Methods Explanation;Handout    Comprehension Verbalized understanding              PT Short Term Goals - 11/30/21 1252       PT SHORT TERM GOAL #1   Title pt to be I with  initial HEP    Baseline no previous HEP    Time 4    Period Weeks    Status New    Target Date 12/28/21      PT SHORT TERM GOAL #2   Title report a 30% reduction in Rt UE radiculopathy with reaching, lifting and sitting    Baseline --    Time 4    Period Weeks    Status New    Target Date 12/28/21      PT SHORT TERM GOAL #3   Title sit with neutral seated posture and report corrections in posture and mechanics with work tasks to reduce radiculopathy    Time 4    Period Weeks    Status New    Target Date 12/28/21               PT Long Term Goals - 11/30/21 1254       PT LONG TERM GOAL #1   Title be independent in advanced HEP    Baseline --    Time 8    Status New    Target Date 01/25/22      PT LONG TERM GOAL #2   Title report > or = to 60% reduction in Rt UE radiculopathy with reaching and sitting    Baseline --    Time 8    Period Weeks    Status New    Target Date 01/25/22      PT LONG TERM GOAL #3   Title improve FOTO to > or = to 69    Baseline 65     Time 8    Period Weeks    Status New    Target Date 01/25/22      PT LONG TERM GOAL #4   Title improve Rt grip strength to > or = to 65# to improve lifting and carrying items    Baseline 47#    Time 8    Period Weeks    Status New    Target Date 01/25/22      PT LONG TERM GOAL #5   Title demonstrate > or = to 4+/5 Rt shoulder and elbow strength to improve functional UE endurance    Time 8    Period Weeks    Status New    Target Date 01/25/22                   Plan - 12/26/21 1035     Clinical Impression Statement Pt is consistent and compliant with HEP.  Session focused on thoracic strength, mobility and DN to address tissue and segmental mobility in the cervical and thoracic pain. Pt with scapular elevation with arm bike on the Rt side and required cueing to depress.  Pt with tension and trigger points in bil neck and upper traps and demonstrated improved tissue mobility after manual therapy. Pt will continue to benefit from skilled PT to address Rt UE pain, postural weakness and immobility in the neck and thoracic spine.    PT Frequency 2x / week    PT Duration 8 weeks    PT Treatment/Interventions ADLs/Self Care Home Management;Moist Heat;Cryotherapy;Electrical Stimulation;Traction;Therapeutic activities;Therapeutic exercise;Neuromuscular re-education;Taping;Manual techniques;Passive range of motion;Joint Manipulations;Spinal Manipulations    PT Next Visit Plan add thoracic mobility to HEP, DN to Rt scapula if needed, assess response to DN to neck and upper traps    PT Home Exercise Plan Access Code: [email or web address]    Consulted and Agree with Plan of  Care Patient             Patient will benefit from skilled therapeutic intervention in order to improve the following deficits and impairments:  Decreased activity tolerance, Impaired flexibility, Postural dysfunction, Pain, Impaired UE functional use, Improper body mechanics, Hypomobility, Increased muscle spasms  Visit  Diagnosis: Pain in right arm  Muscle weakness (generalized)  Abnormal posture     Problem List Patient Active Problem List   Diagnosis Date Noted   Hypersomnia, unspecified 04/19/2020   Loud snoring 04/19/2020   Sleep disorder, circadian, shift work type 04/19/2020   Well woman exam 10/20/2015   CIN III with severe dysplasia 08/19/2014    Lorrene Reid, PT 12/26/21 11:04 AM   [facility] [address] [place], [place], [address] Phone: [phone]   Fax:  [phone]  Name: Tewana Bohlen MRN: [id number] Date of Birth: Jan 12, 1981

## 2021-12-26 NOTE — Patient Instructions (Signed)

## 2021-12-29 ENCOUNTER — Other Ambulatory Visit: Payer: Self-pay

## 2021-12-29 ENCOUNTER — Ambulatory Visit: Payer: Medicaid Other | Admitting: Physical Therapy

## 2021-12-29 DIAGNOSIS — M79601 Pain in right arm: Secondary | ICD-10-CM | POA: Diagnosis not present

## 2021-12-29 DIAGNOSIS — R293 Abnormal posture: Secondary | ICD-10-CM

## 2021-12-29 DIAGNOSIS — M6281 Muscle weakness (generalized): Secondary | ICD-10-CM

## 2021-12-29 NOTE — Therapy (Signed)
[facility] @ [facility] [address] [place], [place], [address] Phone: [phone]   Fax:  [phone]  Physical Therapy Treatment  Patient Details  Name: Jocelyn Townsend MRN: [id number] Date of Birth: 1981-06-21 Referring Provider (PT): Margarita Rana, MD   Encounter Date: 12/29/2021   PT End of Session - 12/29/21 0758     Visit Number 5    Date for PT Re-Evaluation 01/25/22    Authorization Type [employer]- 27 visit limit    PT Start Time 0800    PT Stop Time 0840    PT Time Calculation (min) 40 min    Activity Tolerance Patient tolerated treatment well             Past Medical History:  Diagnosis Date   Carpal tunnel syndrome    Intranasal nodule    Migraines    Prediabetes    Vitamin D deficiency     Past Surgical History:  Procedure Laterality Date   TUBAL LIGATION  2009    There were no vitals filed for this visit.   Subjective Assessment - 12/29/21 0759     Subjective Feeling good. Less frequent.  DN really helped.  A little tingling in the fingers but less often.  Able to sleep through the night.    Pertinent History left wrist surgery last Summer with limit wrist ROM    Patient Stated Goals reduce Rt UE pain, improve Rt UE strength    Currently in Pain? No/denies    Pain Score 0-No pain                               [facility] Adult PT Treatment/Exercise - 12/29/21 0001       Neck Exercises: Machines for Strengthening   Other Machines for Strengthening lat bar 30# standing 15x    Other Machines for Strengthening seated Matrix 2x10 25#      Neck Exercises: Theraband   Other Theraband Exercises wall clocks green loop 5x right/left    Other Theraband Exercises green loop scoops 5x; green loop slides/lift offs at top on wall 5x each      Neck Exercises: Standing   Other Standing Exercises green band row and rotate body 10x right/left      Neck Exercises: Seated   Other Seated  Exercise thoracic extension with ball 10x      Neck Exercises: Sidelying   Other Sidelying Exercise open books 8x right/left      Neck Exercises: Stretches   Other Neck Stretches quadruped thread the needle with foam roll 8x right/left                     PT Education - 12/29/21 0833     Education Details thoracic extension seated; open books; wall band ex's    Person(s) Educated Patient    Methods Explanation;Handout;Demonstration    Comprehension Verbalized understanding;Returned demonstration              PT Short Term Goals - 11/30/21 1252       PT SHORT TERM GOAL #1   Title pt to be I with initial HEP    Baseline no previous HEP    Time 4    Period Weeks    Status New    Target Date 12/28/21      PT SHORT TERM GOAL #2   Title report a 30% reduction in Rt UE  radiculopathy with reaching, lifting and sitting    Baseline --    Time 4    Period Weeks    Status New    Target Date 12/28/21      PT SHORT TERM GOAL #3   Title sit with neutral seated posture and report corrections in posture and mechanics with work tasks to reduce radiculopathy    Time 4    Period Weeks    Status New    Target Date 12/28/21               PT Long Term Goals - 11/30/21 1254       PT LONG TERM GOAL #1   Title be independent in advanced HEP    Baseline --    Time 8    Status New    Target Date 01/25/22      PT LONG TERM GOAL #2   Title report > or = to 60% reduction in Rt UE radiculopathy with reaching and sitting    Baseline --    Time 8    Period Weeks    Status New    Target Date 01/25/22      PT LONG TERM GOAL #3   Title improve FOTO to > or = to 69    Baseline 65    Time 8    Period Weeks    Status New    Target Date 01/25/22      PT LONG TERM GOAL #4   Title improve Rt grip strength to > or = to 65# to improve lifting and carrying items    Baseline 47#    Time 8    Period Weeks    Status New    Target Date 01/25/22      PT LONG TERM  GOAL #5   Title demonstrate > or = to 4+/5 Rt shoulder and elbow strength to improve functional UE endurance    Time 8    Period Weeks    Status New    Target Date 01/25/22                   Plan - 12/29/21 0824     Clinical Impression Statement "When I had PT in the past for shoulder problems it didn't help much.  This round of PT has really helped me a lot."  The patient denies pain or UE symptoms during all ex's today.  She states in the past she had numbness with gripping but did not have that issue today.  Treatment emphasis on thoracic mobility, UE loading and periscapular muscle strengthening.    Comorbidities Lt wrist surgery    Examination-Activity Limitations Sit;Lift    Examination-Participation Restrictions Driving;Laundry;Meal Prep;Shop    Rehab Potential Good    PT Frequency 2x / week    PT Duration 8 weeks    PT Treatment/Interventions ADLs/Self Care Home Management;Moist Heat;Cryotherapy;Electrical Stimulation;Traction;Therapeutic activities;Therapeutic exercise;Neuromuscular re-education;Taping;Manual techniques;Passive range of motion;Joint Manipulations;Spinal Manipulations    PT Next Visit Plan neural mobility, upper quarter strengthening; thoracic mobility; DN as needed    PT Home Exercise Plan Access Code: [email or web address]             Patient will benefit from skilled therapeutic intervention in order to improve the following deficits and impairments:  Decreased activity tolerance, Impaired flexibility, Postural dysfunction, Pain, Impaired UE functional use, Improper body mechanics, Hypomobility, Increased muscle spasms  Visit Diagnosis: Pain in right arm  Muscle weakness (generalized)  Abnormal posture  Problem List Patient Active Problem List   Diagnosis Date Noted   Hypersomnia, unspecified 04/19/2020   Loud snoring 04/19/2020   Sleep disorder, circadian, shift work type 04/19/2020   Well woman exam 10/20/2015   CIN III with severe dysplasia  08/19/2014   Lavinia Sharps, PT 12/29/21 12:26 PM Phone: [phone] Fax: [phone]  Vivien Presto, PT 12/29/2021, 12:26 PM  [facility] [address] [place], [place], [address] Phone: [phone]   Fax:  [phone]  Name: Jocelyn Townsend MRN: [id number] Date of Birth: 03-13-1981

## 2021-12-29 NOTE — Patient Instructions (Signed)
Access Code: [email or web address] URL: [email or web address] Date: 12/29/2021 Prepared by: Lavinia Sharps  Exercises Seated Cervical Flexion AROM - 3 x daily - 7 x weekly - 1 sets - 3 reps - 20 hold Seated Cervical Sidebending AROM - 3 x daily - 7 x weekly - 1 sets - 3 reps - 20 hold Seated Cervical Rotation AROM - 3 x daily - 7 x weekly - 1 sets - 3 reps - 20 hold Seated Correct Posture - 1 x daily - 7 x weekly - 3 sets - 10 reps Standing Median Nerve Glide - 3 x daily - 7 x weekly - 1 sets - 10 reps Shoulder External Rotation and Scapular Retraction with Resistance - 2 x daily - 7 x weekly - 2 sets - 10 reps Shoulder Extension with Resistance - 2 x daily - 7 x weekly - 2 sets - 10 reps Standing Bilateral Low Shoulder Row with Anchored Resistance - 2 x daily - 7 x weekly - 2 sets - 10 reps Standing Shoulder Horizontal Abduction with Resistance - 2 x daily - 7 x weekly - 2 sets - 10 reps Prone Shoulder Extension - Single Arm - 2 x daily - 7 x weekly - 2 sets - 10 reps Prone Shoulder Row - 2 x daily - 7 x weekly - 2 sets - 10 reps Prone Single Arm Shoulder Horizontal Abduction with Scapular Retraction and Palm Down - 2 x daily - 7 x weekly - 2 sets - 10 reps Sidelying Shoulder External Rotation - 2 x daily - 7 x weekly - 2 sets - 10 reps Single Arm Serratus Punches in Supine with Dumbbell - 2 x daily - 7 x weekly - 2 sets - 10 reps Seated Thoracic Lumbar Extension with Pectoralis Stretch - 1 x daily - 7 x weekly - 1 sets - 10 reps Sidelying Open Book Thoracic Rotation with Knee on Foam Roll - 1 x daily - 7 x weekly - 1 sets - 10 reps PILATES ROW AND ROTATE - 1 x daily - 7 x weekly - 1 sets - 10 reps Wall Clock with Theraband - 1 x daily - 7 x weekly - 1 sets - 10 reps

## 2022-01-02 ENCOUNTER — Ambulatory Visit: Payer: Medicaid Other

## 2022-01-02 ENCOUNTER — Telehealth: Payer: Self-pay

## 2022-01-02 NOTE — Telephone Encounter (Signed)
Called pt due to no-show for appt.  Not able to leave a message.

## 2022-01-04 ENCOUNTER — Ambulatory Visit: Payer: Medicaid Other

## 2022-01-04 ENCOUNTER — Telehealth: Payer: Self-pay

## 2022-01-04 NOTE — Telephone Encounter (Signed)
Left message due to 2nd no-show appt

## 2022-03-12 IMAGING — DX DG ELBOW COMPLETE 3+V*R*
4 series · 4 of 4 positions shown · non-contrast
Comparison: None.

CLINICAL DATA: Right arm/shoulder pain and swelling from a fall
that occurred in September 2020. Initial encounter.

EXAM:
RIGHT ELBOW - COMPLETE 3+ VIEW

[elbow ap]
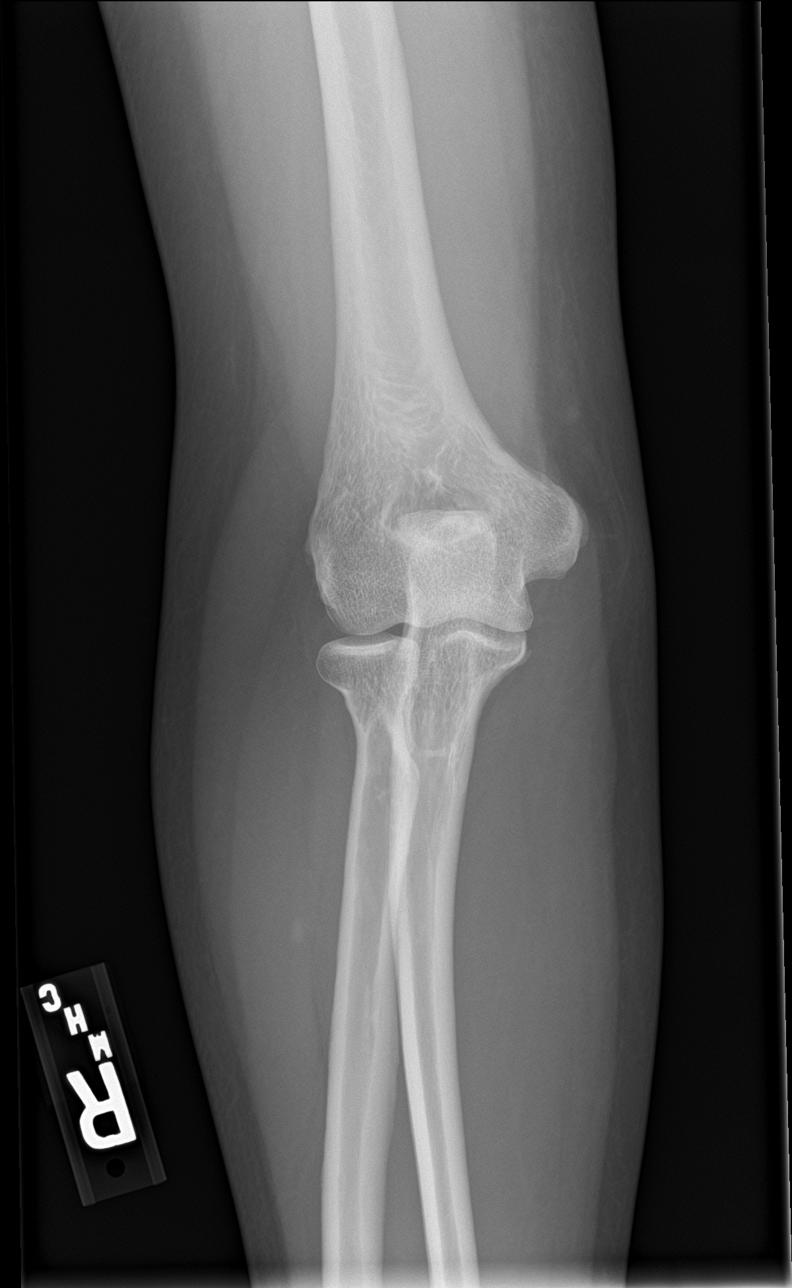

[elbow obl (1 of 2)]
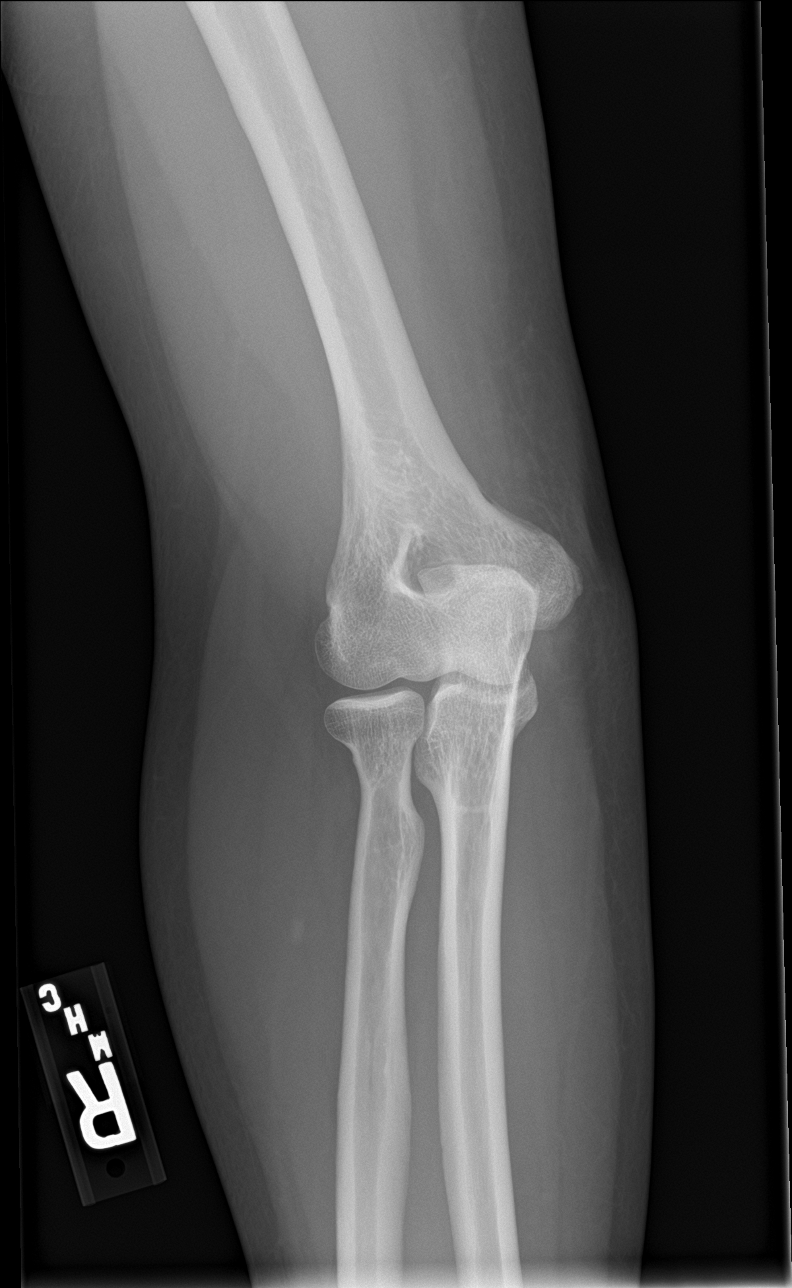

[elbow obl (2 of 2)]
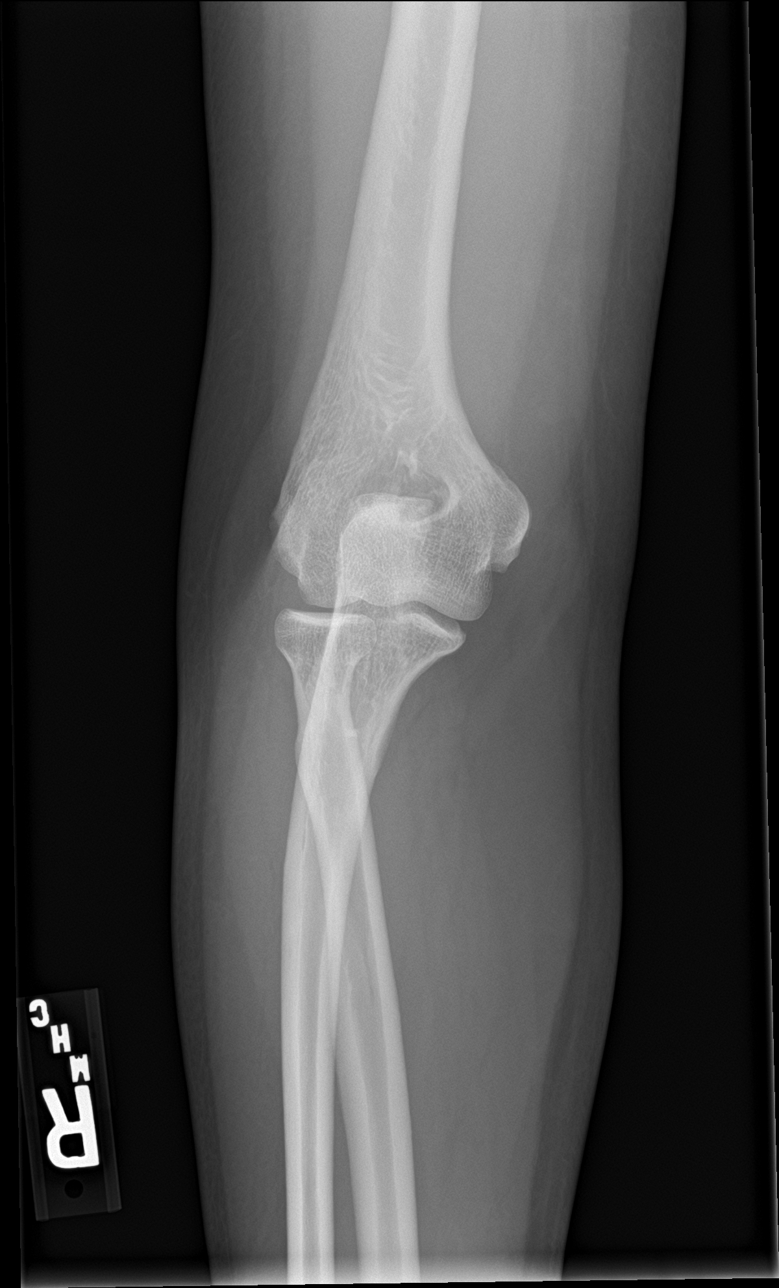

[elbow lat]
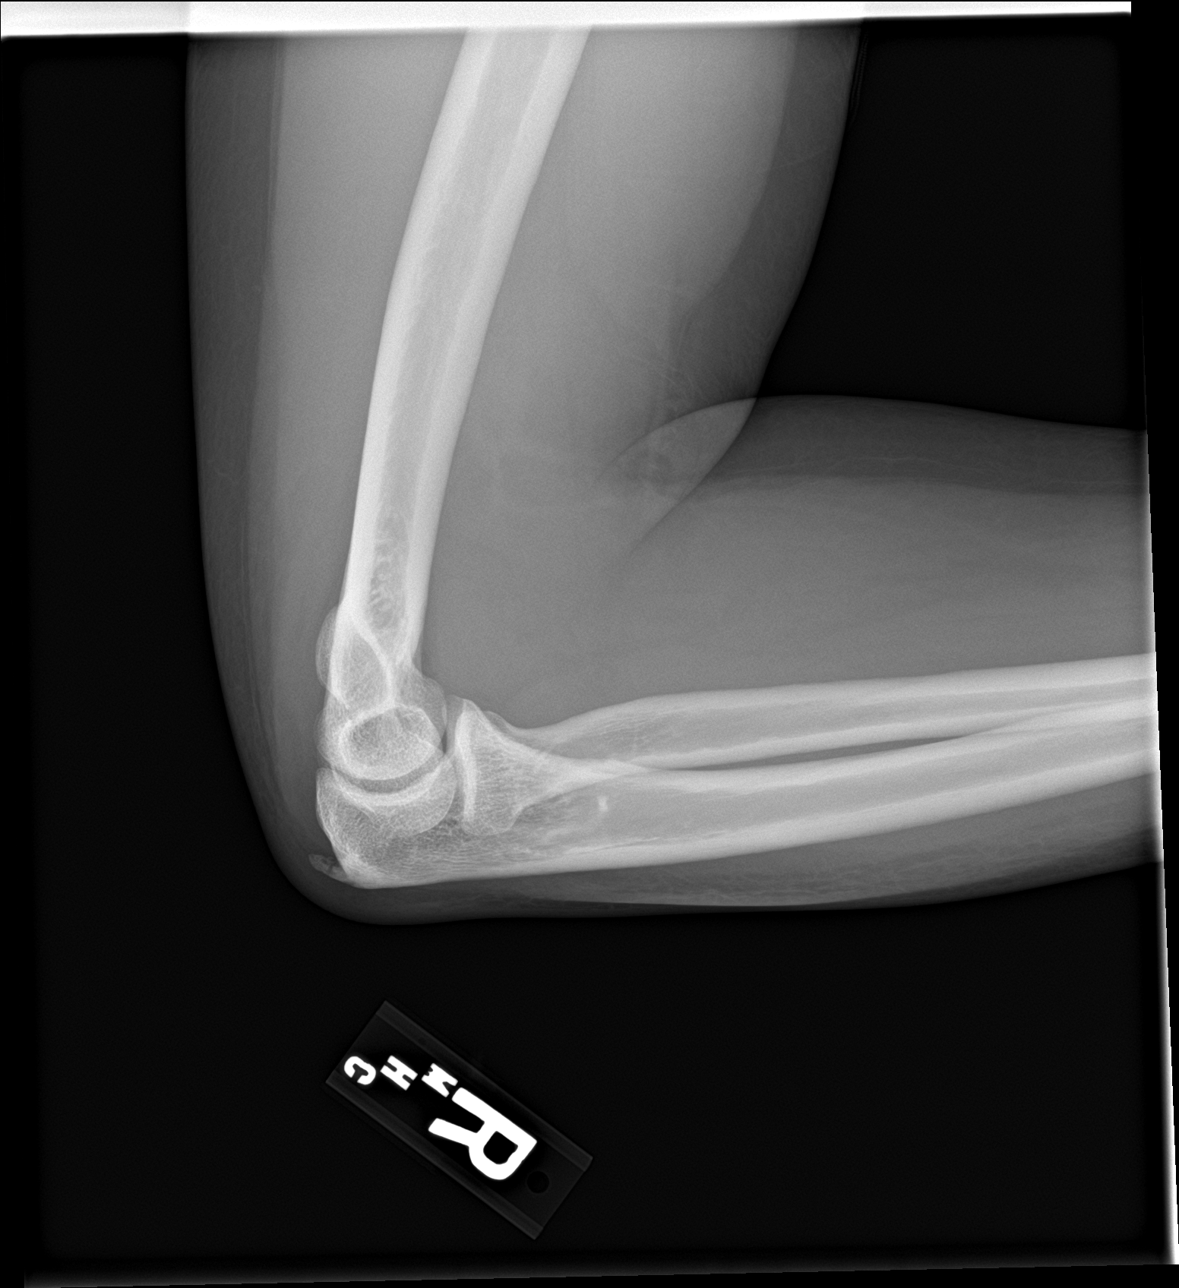

[4 of 4 positions shown; findings below may reference images not displayed]

FINDINGS: No evidence of acute trauma. Prominent enthesophyte off the proximal
ulna.
IMPRESSION: No evidence of acute or remote trauma.

## 2022-04-21 ENCOUNTER — Encounter (HOSPITAL_COMMUNITY): Payer: Self-pay

## 2022-04-21 ENCOUNTER — Emergency Department (HOSPITAL_COMMUNITY)
Admission: EM | Admit: 2022-04-21 | Discharge: 2022-04-21 | Disposition: A | Discharge status: Home / Self Care | Payer: Medicaid Other | Attending: Emergency Medicine | Admitting: Emergency Medicine

## 2022-04-21 ENCOUNTER — Other Ambulatory Visit: Payer: Self-pay

## 2022-04-21 ENCOUNTER — Emergency Department (HOSPITAL_COMMUNITY): Payer: Medicaid Other

## 2022-04-21 DIAGNOSIS — M62838 Other muscle spasm: Secondary | ICD-10-CM | POA: Diagnosis not present

## 2022-04-21 DIAGNOSIS — M542 Cervicalgia: Secondary | ICD-10-CM | POA: Insufficient documentation

## 2022-04-21 DIAGNOSIS — M5481 Occipital neuralgia: Secondary | ICD-10-CM | POA: Diagnosis not present

## 2022-04-21 DIAGNOSIS — R7309 Other abnormal glucose: Secondary | ICD-10-CM | POA: Insufficient documentation

## 2022-04-21 DIAGNOSIS — M436 Torticollis: Secondary | ICD-10-CM

## 2022-04-21 LAB — BASIC METABOLIC PANEL
Anion gap: 9 (ref 5–15)
BUN: 13 mg/dL (ref 6–20)
CO2: 23 mmol/L (ref 22–32)
Calcium: 9.2 mg/dL (ref 8.9–10.3)
Chloride: 108 mmol/L (ref 98–111)
Creatinine, Ser: 0.94 mg/dL (ref 0.44–1.00)
GFR, Estimated: >60 mL/min (ref >60)
Glucose, Bld: 121 mg/dL — ABNORMAL HIGH (ref 70–99)
Potassium: 3.9 mmol/L (ref 3.5–5.1)
Sodium: 140 mmol/L (ref 135–145)

## 2022-04-21 LAB — CBC
HCT: 36.9 % (ref 36.0–46.0)
Hemoglobin: 12.2 g/dL (ref 12.0–15.0)
MCH: 28.9 pg (ref 26.0–34.0)
MCHC: 33.1 g/dL (ref 30.0–36.0)
MCV: 87.4 fL (ref 80.0–100.0)
Platelets: 211 10*3/uL (ref 150–400)
RBC: 4.22 MIL/uL (ref 3.87–5.11)
RDW: 13.6 % (ref 11.5–15.5)
WBC: 6.8 10*3/uL (ref 4.0–10.5)
nRBC: 0 % (ref 0.0–0.2)

## 2022-04-21 MED ORDER — LORAZEPAM 1 MG PO TABS
1.0000 mg | ORAL_TABLET | Freq: Once | ORAL | Status: AC
  Administered 2022-04-21: 1 mg via ORAL
  Filled 2022-04-21: qty 1

## 2022-04-21 MED ORDER — BETAMETHASONE SOD PHOS & ACET 6 (3-3) MG/ML IJ SUSP
6.0000 mg | Freq: Once | INTRAMUSCULAR | Status: AC
  Administered 2022-04-21: 6 mg via INTRA_ARTICULAR
  Filled 2022-04-21: qty 1

## 2022-04-21 MED ORDER — BUPIVACAINE-EPINEPHRINE (PF) 0.25% -1:200000 IJ SOLN
30.0000 mL | Freq: Once | INTRAMUSCULAR | Status: DC
  Filled 2022-04-21: qty 30

## 2022-04-21 MED ORDER — HYDROCODONE-ACETAMINOPHEN 5-325 MG PO TABS: 1.0000 | ORAL_TABLET | ORAL | 0 refills | Status: DC | PRN

## 2022-04-21 MED ORDER — BACLOFEN 10 MG PO TABS: 5.0000 mg | ORAL_TABLET | Freq: Three times a day (TID) | ORAL | 0 refills | Status: DC | PRN

## 2022-04-21 MED ORDER — OXYCODONE-ACETAMINOPHEN 5-325 MG PO TABS
2.0000 | ORAL_TABLET | Freq: Once | ORAL | Status: AC
  Administered 2022-04-21: 2 via ORAL
  Filled 2022-04-21: qty 2

## 2022-04-21 NOTE — Discharge Instructions (Signed)
Contact a health care provider if: Your medicine is not working. You have new or worsening symptoms. Get help right away if: You have very bad head pain that does not go away. You have a sudden change in vision, balance, or speech. 

## 2022-04-21 NOTE — ED Provider Notes (Signed)
[facility] EMERGENCY DEPARTMENT Provider Note   CSN: [id number] Arrival date & time: 04/21/22  0107     History  Chief Complaint  Patient presents with   Neck Pain    Jocelyn Townsend is a 41 y.o. female who presents emergency department chief complaint of right-sided neck pain.  Patient states that she awoke with pain in her occipital region 2 nights ago however she has had progressively worsening now severe pain with sharp, shooting, electrical pain at the base of the occiput going on all day.  She took 800 of ibuprofen and took 2 tablets of Lyrica that she was given by her significant other without any relief of her symptoms.  She has never had pain like this before.  She denies any kind of previous injury to the neck.  She denies upper extremity weakness numbness.  She denies fever or changes in vision.   Neck Pain     Home Medications Prior to Admission medications   Medication Sig Start Date End Date Taking? Authorizing Provider  B Complex-C (SUPER B COMPLEX PO) Take by mouth.    [provider]  cholecalciferol (VITAMIN D) 1000 UNITS tablet Take 1,000 Units by mouth daily.    [provider]  glucosamine-chondroitin 500-400 MG tablet Take 1 tablet by mouth 3 (three) times daily.    [provider]  ibuprofen (ADVIL) 800 MG tablet Take 1 tablet (800 mg total) by mouth daily. 01/24/21   Hazel Sams, PA-C  Melatonin 2.5 MG CHEW Take one at bedtime po 04/19/20   Dohmeier, Asencion Partridge, MD  Multiple Vitamin (MULTIVITAMIN) capsule Take by mouth.    [provider]  naproxen (NAPROSYN) 500 MG tablet Take 1 tablet (500 mg total) by mouth 2 (two) times daily as needed for moderate pain. Patient not taking: Reported on 11/30/2021 02/19/21   Petrucelli, Aldona Bar R, PA-C  Olopatadine HCl 0.7 % SOLN Place 1 drop into both eyes in the morning. Patient not taking: Reported on 11/30/2021    [provider]  UNABLE TO FIND Med Name: Move Free  Plus MSM-Vit D3 750 mg-100 mg-25 mcg oral tablet Patient not taking: Reported on 11/30/2021    [provider]      Allergies    Nitrofurantoin monohyd macro    Review of Systems   Review of Systems  Musculoskeletal:  Positive for neck pain.   Physical Exam Updated Vital Signs BP (!) 145/97 (BP Location: Right Arm)   Pulse 70   Temp 98.3 F (36.8 C) (Oral)   Resp 18   SpO2 97%  Physical Exam Vitals and nursing note reviewed.  Constitutional:      General: She is not in acute distress.    Appearance: She is well-developed. She is not diaphoretic.     Comments: Patient sitting with her neck rigidly upright.  She is tearful, guarding movement.  Intermittently she winces and cries due to shooting pain.  She grabs the back of her right occiput.  HENT:     Head: Normocephalic and atraumatic.     Right Ear: External ear normal.     Left Ear: External ear normal.     Nose: Nose normal.     Mouth/Throat:     Mouth: Mucous membranes are moist.  Eyes:     General: No scleral icterus.    Conjunctiva/sclera: Conjunctivae normal.  Neck:     Comments: Quizzically tender to palpation suboccipital region, no obvious signs of infection, range of motion  limited due to pain Cardiovascular:     Rate and Rhythm: Normal rate and regular rhythm.     Heart sounds: Normal heart sounds. No murmur heard.   No friction rub. No gallop.  Pulmonary:     Effort: Pulmonary effort is normal. No respiratory distress.     Breath sounds: Normal breath sounds.  Abdominal:     General: Bowel sounds are normal. There is no distension.     Palpations: Abdomen is soft. There is no mass.     Tenderness: There is no abdominal tenderness. There is no guarding.  Skin:    General: Skin is warm and dry.  Neurological:     Mental Status: She is alert and oriented to person, place, and time.  Psychiatric:        Behavior: Behavior normal.    ED Results / Procedures / Treatments   Labs (all labs  ordered are listed, but only abnormal results are displayed) Labs Reviewed  BASIC METABOLIC PANEL - Abnormal; Notable for the following components:      Result Value   Glucose, Bld 121 (*)    All other components within normal limits  CBC    EKG None  Radiology CT Cervical Spine Wo Contrast  Result Date: 04/21/2022 CLINICAL DATA:  Neck stiffness and pain EXAM: CT CERVICAL SPINE WITHOUT CONTRAST TECHNIQUE: Multidetector CT imaging of the cervical spine was performed without intravenous contrast. Multiplanar CT image reconstructions were also generated. RADIATION DOSE REDUCTION: This exam was performed according to the departmental dose-optimization program which includes automated exposure control, adjustment of the mA and/or kV according to patient size and/or use of iterative reconstruction technique. COMPARISON:  None Available. FINDINGS: Alignment: Straightening of the cervical spine. No subluxation. Facet alignment within normal limits. Skull base and vertebrae: No acute fracture. No primary bone lesion or focal pathologic process. Soft tissues and spinal canal: No prevertebral fluid or swelling. No visible canal hematoma. Disc levels: Mild disc space narrowing C4-C5 and C5-C6. No high-grade canal stenosis. The bony foramen appear patent. Upper chest: Negative. Other: None IMPRESSION: Straightening of the cervical spine with mild degenerative change at C4-C5 and C5-C6. No acute osseous abnormality Electronically Signed   By: Donavan Foil M.D.   On: 04/21/2022 03:02    Procedures .Nerve Block  Date/Time: 04/21/2022 4:45 AM Performed by: Margarita Mail, PA-C Authorized by: Margarita Mail, PA-C   Consent:    Consent obtained:  Verbal   Consent given by:  Patient   Risks discussed:  Allergic reaction, bleeding, infection, intravenous injection, pain, unsuccessful block and swelling Universal protocol:    Patient identity confirmed:  Verbally with patient Indications:    Indications:   Pain relief Location:    Body area:  Head   Head nerve:  Lesser occipital   Laterality:  Right Pre-procedure details:    Skin preparation:  Chlorhexidine   Preparation: Patient was prepped and draped in usual sterile fashion   Skin anesthesia:    Skin anesthesia method:  Local infiltration   Local anesthetic:  Lidocaine 1% w/o epi Procedure details:    Block needle gauge:  27 G   Guidance comment:  Landmarks   Anesthetic injected:  Lidocaine 1% w/o epi   Steroid injected:  Betamethasone   Additive injected:  None   Injection procedure:  Anatomic landmarks identified and negative aspiration for blood   Paresthesia:  None Post-procedure details:    Outcome:  Pain improved   Procedure completion:  Tolerated well, no immediate complications  Medications Ordered in ED Medications  bupivacaine-epinephrine (PF) (MARCAINE W/ EPI) 0.25% -1:200000 injection 30 mL (has no administration in time range)  oxyCODONE-acetaminophen (PERCOCET/ROXICET) 5-325 MG per tablet 2 tablet (has no administration in time range)  LORazepam (ATIVAN) tablet 1 mg (has no administration in time range)  betamethasone acetate-betamethasone sodium phosphate (CELESTONE) injection 6 mg (has no administration in time range)    ED Course/ Medical Decision Making/ A&P                           Medical Decision Making 41 year old female who presents the emergency department chief complaint of right-sided head and neck pain.  Pain is sharp, she has associated muscle spasm, pain across the scalp, sharp pain with movement of the neck.  After occipital nerve block she has significant improvement in the sharpness of her pain although she still has limited range of motion and tenderness.  No evidence of acute fracture of the C-spine or other acute pathology.  PDMP reviewed and patient discharged with Norco and baclofen.  She was given referral to outpatient neurology.  Discussed return precautions, home care  Amount and/or  Complexity of Data Reviewed Labs: ordered.    Details: BMP shows mildly elevated blood glucose, CBC without significant abnormality Radiology: ordered and independent interpretation performed.    Details: I ordered and interpreted CT C-spine which shows some degenerative changes, no other acute findings  Risk Prescription drug management.           Final Clinical Impression(s) / ED Diagnoses Final diagnoses:  None    Rx / DC Orders ED Discharge Orders     None         Margarita Mail, PA-C 04/21/22 0509    Quintella Reichert, MD 04/22/22 0131

## 2022-04-21 NOTE — ED Triage Notes (Signed)
Pt reports she is here today due to neck stiffness & pain located in the back of her head. Pt reports it started x4 days ago . Pt reports she thought she slept wrong but the pain is still going on. Pt reports it hurts to touch her neck

## 2022-05-09 ENCOUNTER — Other Ambulatory Visit: Payer: Self-pay

## 2022-05-09 ENCOUNTER — Other Ambulatory Visit: Payer: Self-pay | Admitting: Family Medicine

## 2022-05-09 DIAGNOSIS — Z1231 Encounter for screening mammogram for malignant neoplasm of breast: Secondary | ICD-10-CM

## 2022-05-11 ENCOUNTER — Ambulatory Visit
Admission: RE | Admit: 2022-05-11 | Discharge: 2022-05-11 | Disposition: A | Discharge status: Home / Self Care | Payer: Medicaid Other | Source: Ambulatory Visit | Attending: Family Medicine | Admitting: Family Medicine

## 2022-05-11 DIAGNOSIS — Z1231 Encounter for screening mammogram for malignant neoplasm of breast: Secondary | ICD-10-CM

## 2022-05-11 IMAGING — CR DG WRIST COMPLETE 3+V*L*
4 series · 4 of 4 positions shown · non-contrast
Comparison: Wrist radiographs 06/20/2012.

CLINICAL DATA: Wrist pain and swelling since working on a car
yesterday.

EXAM:
LEFT WRIST - COMPLETE 3+ VIEW

[wrist pa]
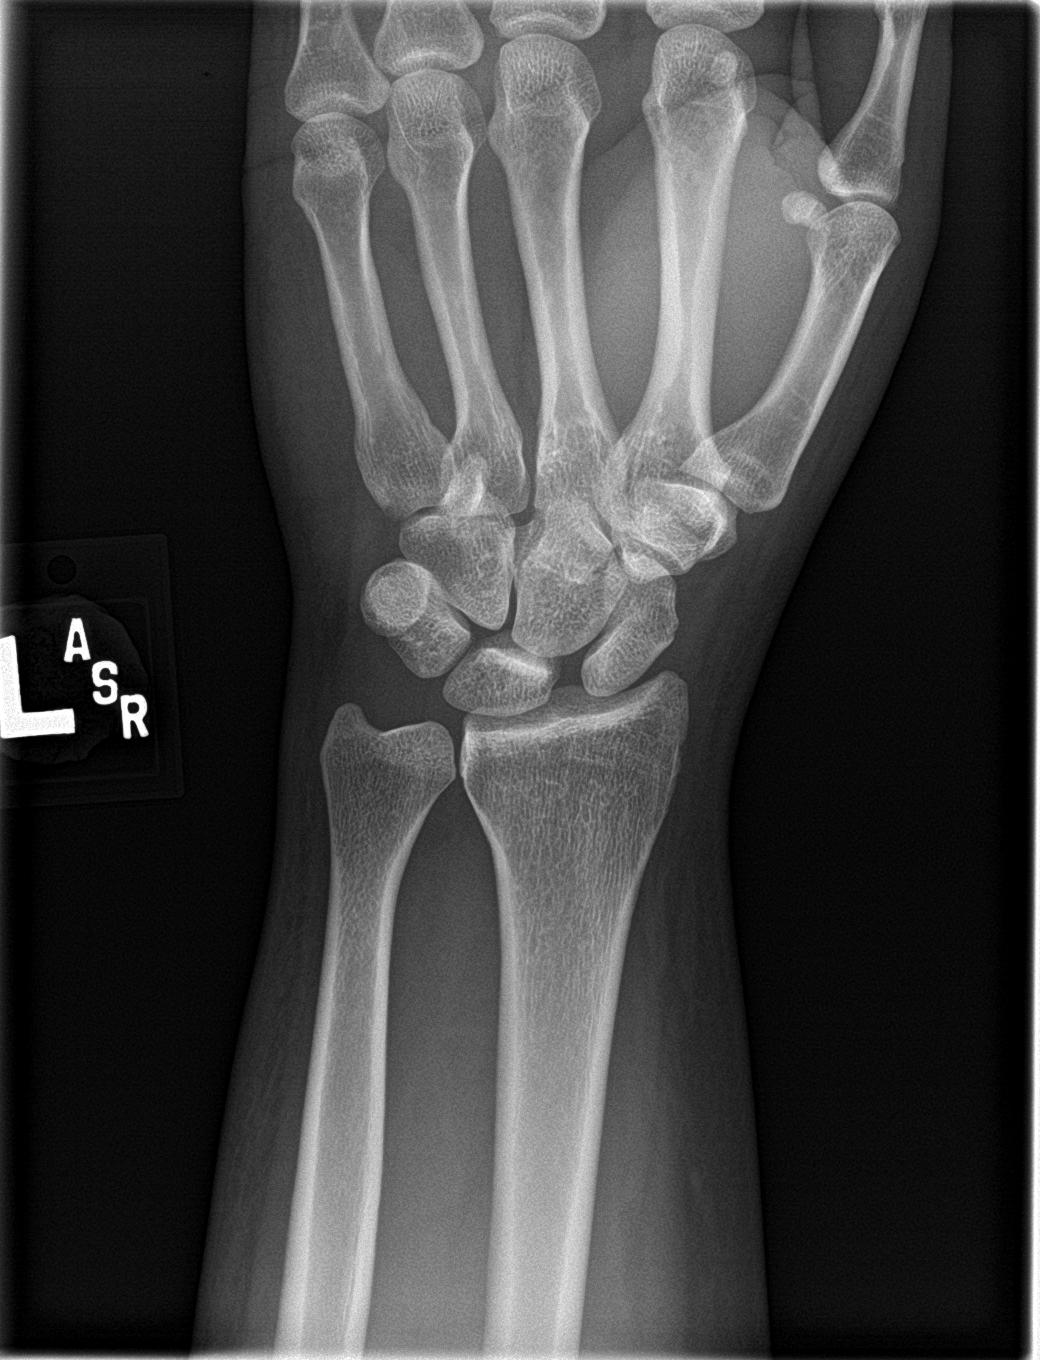

[wrist obl]
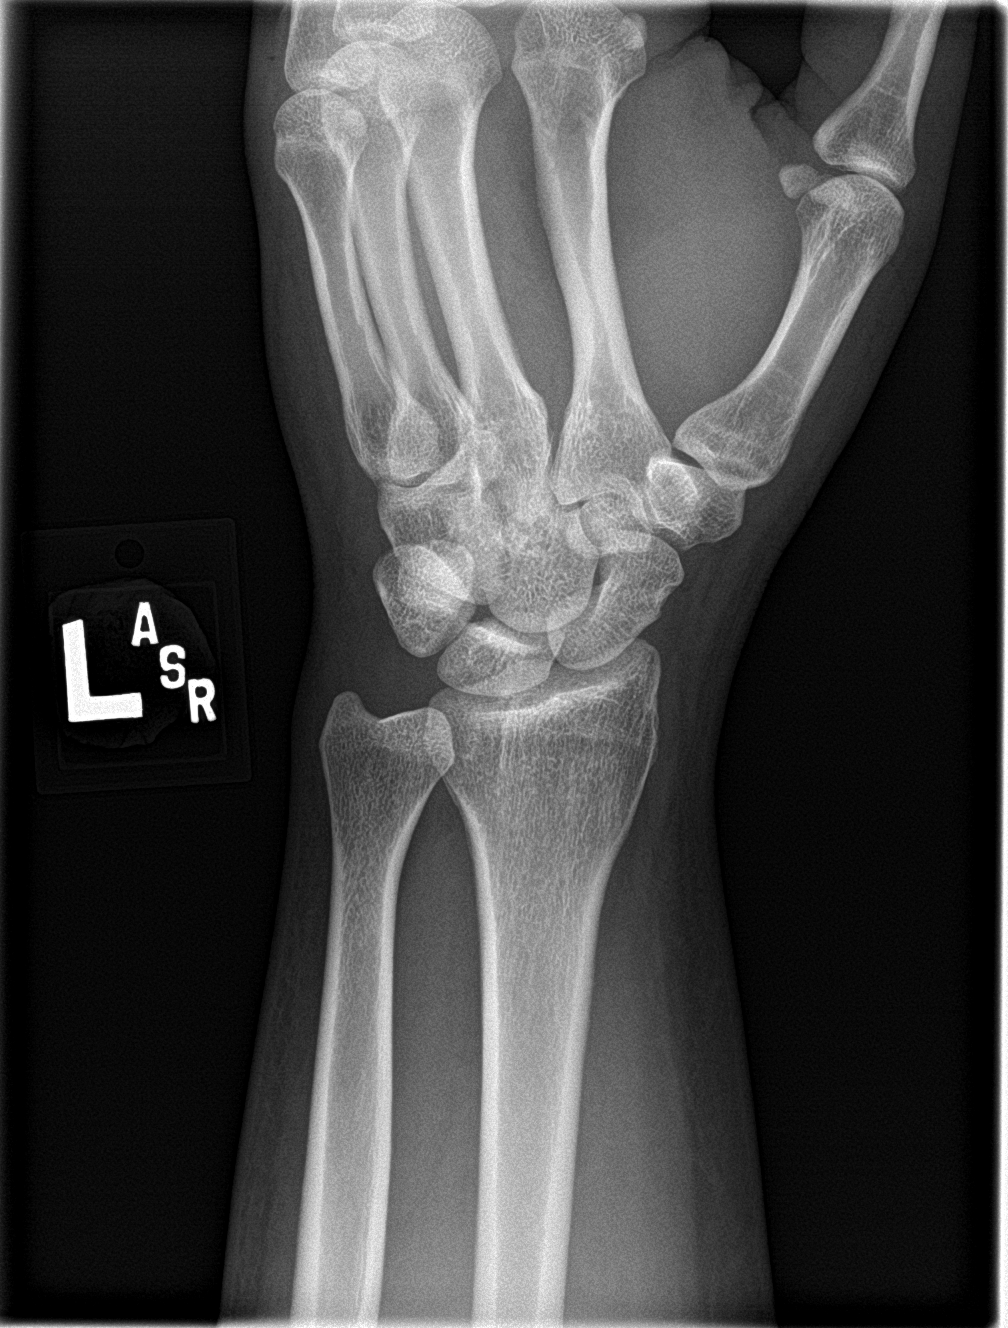

[wrist lat]
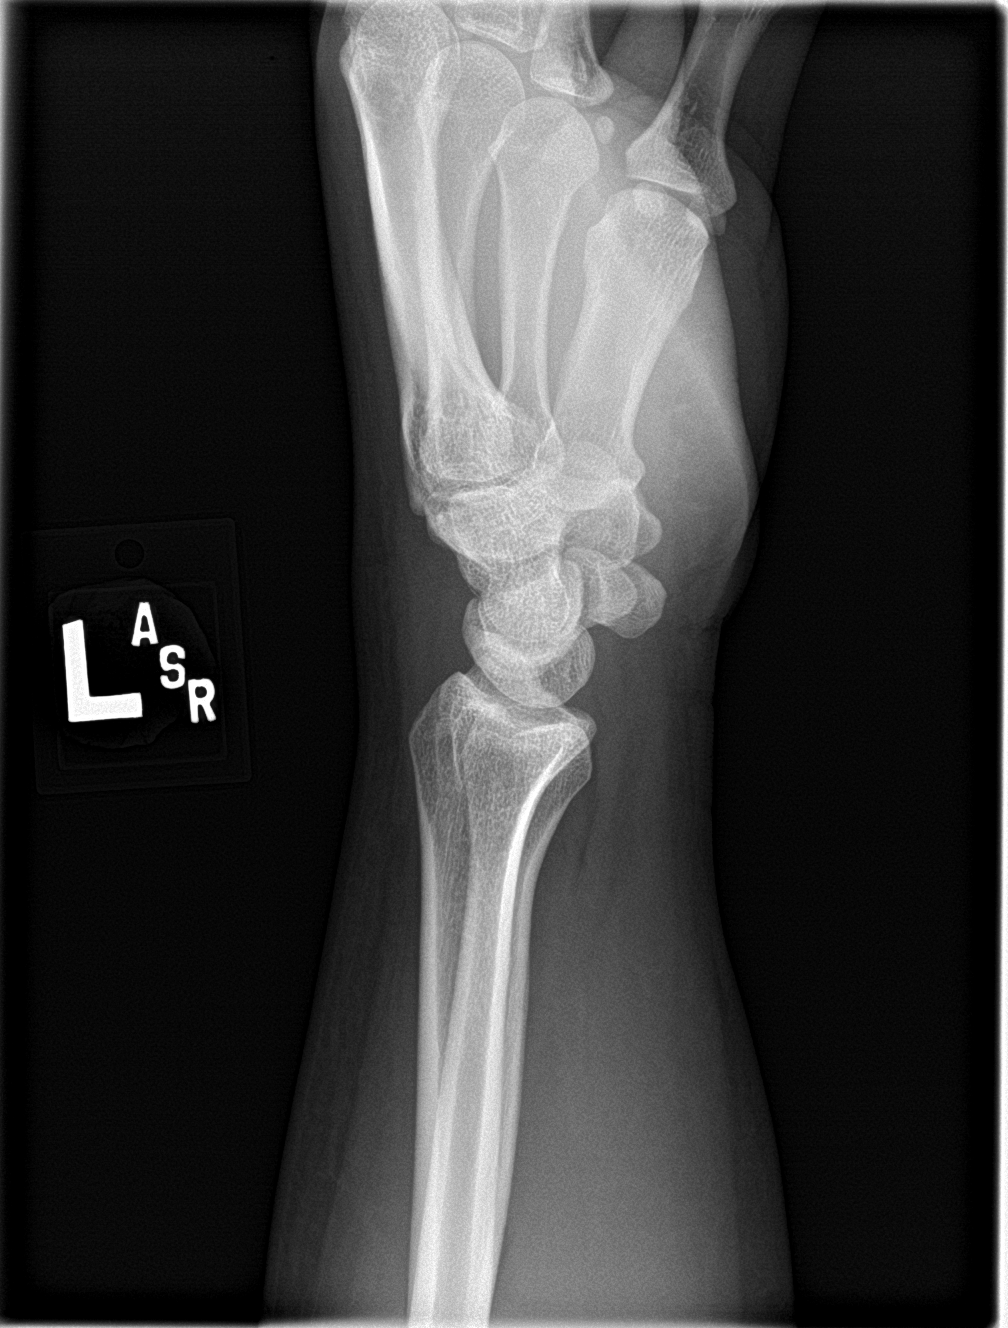

[wrist navicular]
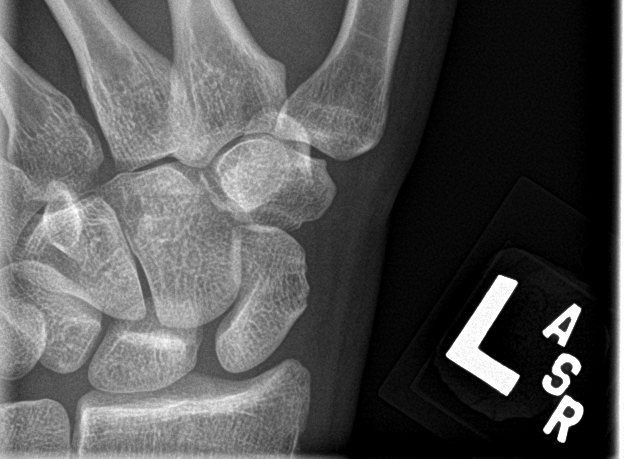

[4 of 4 positions shown; findings below may reference images not displayed]

FINDINGS: No evidence of acute fracture or dislocation. The joint spaces are
preserved. There is scapholunate diastasis suggesting chronic
ligamentous injury. No focal soft tissue abnormalities are seen.
IMPRESSION: Chronic scapholunate diastasis suggesting chronic scapholunate
ligamentous injury. No acute osseous findings.

## 2022-06-12 ENCOUNTER — Encounter: Payer: Self-pay | Admitting: Neurology

## 2022-06-12 ENCOUNTER — Ambulatory Visit (INDEPENDENT_AMBULATORY_CARE_PROVIDER_SITE_OTHER): Payer: Medicaid Other | Admitting: Neurology

## 2022-06-12 VITALS — BP 137/84 | HR 68 | Ht 65.0 in | Wt 156.0 lb

## 2022-06-12 DIAGNOSIS — M5481 Occipital neuralgia: Secondary | ICD-10-CM | POA: Diagnosis not present

## 2022-06-12 MED ORDER — BACLOFEN 10 MG PO TABS: 5.0000 mg | ORAL_TABLET | Freq: Every evening | ORAL | 0 refills | Status: DC | PRN

## 2022-06-12 NOTE — Progress Notes (Signed)
SLEEP MEDICINE CLINIC    Provider:  Melvyn Novas, MD  Primary Care Physician:  [facility] [address] [place] [address]     Referring Provider: Arthor Captain, Pa-c No address on file          Chief Complaint according to patient   Patient presents with:     New Patient (Initial Visit)     snoring, fatigue, headache; she wakes up with headaches sometimes. Yesterday she was groggy and if felt like she slept too much. Even if she sleeps a normal amount of hours she still is fatigued during the day. This has been going on about a month. She reports an average of 5.5 hours of sleep because of work hours. She does not get off until midnight.       HISTORY OF PRESENT ILLNESS:  Jocelyn Townsend is a 41 year- old African American female patient and seen here upon referral on 06/12/2022 from [place], [place] at the [facility] [place]. Jocelyn Townsend is a 41 y.o. female who presented to the  emergency department  04-21-2022: chief complaint of right-sided neck pain.  Patient states that she awoke with pain in her occipital region 2 nights ago however she has had progressively worsening now severe pain with sharp, shooting, electrical pain at the base of the occiput going on all day.  She took 800 of ibuprofen and took 2 tablets of Lyrica that she was given by her significant other without any relief of her symptoms.  She has never had pain like this before.  She denies any kind of previous injury to the neck.  She denies upper extremity weakness numbness.  She denies fever or changes in vision.     Neck Pain: Chief concern according to patient :   Pt referred for occipital neuralgia. Has not seen PCP first, was sent by ED (?) Photophobia.  Pt reports doing well.states to have an electrical shock sensation in the  back right side of neck. She was taking lyrica from her fiancee-  it was not her medication.  She received an injections into the side of the neck.  Nerve block by Buvacaine ?. CT suggested DDD cervical spine. Mild. She has been through physical therapy.       Jocelyn Townsend  has a past medical history of Carpal tunnel syndrome, Intranasal polyp - and  Migraines, Prediabetes, and Vitamin D deficiency. She has been vaccinated against COVID 19.  She contracted Covid 10-2020, and she lost her sense of smell.    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  While she worked basically night shifts at the group home she is now working late shifts but she is going home usually around midnight.  She works now in a Diplomatic Services operational officer, in [employer].  Her sleep pattern still has changed there is more snoring, more fatigue and morning headaches.  Sometimes she wakes up with headaches but the headaches do not wake her. In February she felt ill and as suspected to have COVID but tested negative.  Test was done at [facility]   Sleep relevant medical history:  no Tonsillectomy, known nasal polyp- not removed yet- sinusitis prone. She has a history of abnormal olfactory sensations.     Family medical /sleep history: No other  family member with OSA, insomnia, no sleep walkers.    Social history:  Patient is working as a Art gallery manager  and lives in a household with 4 persons. Family status is single with a girlfriend and 2 children.  One daughter is 35 and one 49.   The patient currently works late shift / used to work in shifts( Chief Technology Officer,) Pets are present, 4 cats. Tobacco use: none   ETOH use; socially ,  Caffeine intake in form of Coffee( 12 cup a day ) Soda(3/ week ) Tea ( soemtimes) or energy drinks. Regular exercise in form of gym, twice a week. Hobbies : gardening, flowers,     Sleep habits are as follows: The patient's dinner time is between 8-10  PM at work. The patient goes to bed  at 2 PM and continues to struggle to sleep- once asleep for 8 hours, wakes for a rare  bathroom breaks,.   The preferred sleep position is on her sides, with the support of 1-2 pillows.  Dreams are reportedly frequent/sometimes vivid.   8 AM is the usual rise time.  The patient wakes up with an alarm set to 8 AM  She picks her girlfriend up from work. .   She reports not feeling refreshed or restored in AM, with symptoms such as dry mouth, morning headaches , and residual fatigue.  Naps are taken frequently, lasting many hours - wakes up more groggy.    Review of Systems: Out of a complete 14 system review, the patient complains of only the following symptoms, and all other reviewed systems are negative.:    Occipital neuralgia.      Fatigue, sleepiness , snoring, Insomnia - some trouble to initiate sleep, some hypersomnia. girlfriend reports she snores, kids confirmed.  Fit-bit indicated frequent wake times.     How likely are you to doze in the following situations: 0 = not likely, 1 = slight chance, 2 = moderate chance, 3 = high chance  2021:   Sitting and Reading? Watching Television? Sitting inactive in a public place (theater or meeting)? As a passenger in a car for an hour without a break? Lying down in the afternoon when circumstances permit? Sitting and talking to someone? Sitting quietly after lunch without alcohol? In a car, while stopped for a few minutes in traffic?   Total = 12/ 24 points   FSS endorsed at 35/ 63 points.   Social History   Socioeconomic History   Marital status: Significant Other    Spouse name: Not on file   Number of children: 2   Years of education: Not on file   Highest education level: High school graduate  Occupational History   Not on file  Tobacco Use   Smoking status: Former    Types: Cigarettes   Smokeless tobacco: Never  Vaping Use   Vaping Use: Never used  Substance and Sexual Activity   Alcohol use: Yes    Alcohol/week:  0.0 standard drinks of alcohol    Comment: occ   Drug use: Yes    Types: Marijuana    Comment: occ   Sexual activity: Yes    Partners: Male    Birth control/protection: Surgical  Other Topics Concern   Not on file  Social History Narrative   Lives at home with children & her partner   Right handed   Caffeine: 2 C of coffee a day, but not all day.    Social Determinants of Health  Financial Resource Strain: Not on file  Food Insecurity: Not on file  Transportation Needs: Not on file  Physical Activity: Not on file  Stress: Not on file  Social Connections: Not on file    Family History  Problem Relation Age of Onset   Breast cancer Maternal Grandmother    Breast cancer Maternal Great-grandmother    Sleep apnea Neg Hx     Past Medical History:  Diagnosis Date   Carpal tunnel syndrome    Intranasal nodule    Migraines    Prediabetes    Vitamin D deficiency     Past Surgical History:  Procedure Laterality Date   TUBAL LIGATION  2009     Current Outpatient Medications on File Prior to Visit  Medication Sig Dispense Refill   baclofen (LIORESAL) 10 MG tablet Take 0.5-1 tablets (5-10 mg total) by mouth 3 (three) times daily as needed for muscle spasms. 30 each 0   cholecalciferol (VITAMIN D) 1000 UNITS tablet Take 1,000 Units by mouth daily.     glucosamine-chondroitin 500-400 MG tablet Take 1 tablet by mouth 3 (three) times daily.     ibuprofen (ADVIL) 800 MG tablet Take 1 tablet (800 mg total) by mouth daily. 7 tablet 0   Multiple Vitamin (MULTIVITAMIN) capsule Take by mouth.     UNABLE TO FIND Med Name: Move Free Plus MSM-Vit D3 750 mg-100 mg-25 mcg oral tablet     No current facility-administered medications on file prior to visit.    Allergies  Allergen Reactions   Nitrofurantoin Monohyd Macro     Vomiting blood    Physical exam:  Today's Vitals   06/12/22 1407  BP: 137/84  Pulse: 68  Weight: 156 lb (70.8 kg)  Height: 5\' 5"  (1.651 m)   Body mass  index is 25.96 kg/m.   Wt Readings from Last 3 Encounters:  06/12/22 156 lb (70.8 kg)  04/21/22 154 lb 12.2 oz (70.2 kg)  03/10/21 154 lb 12.8 oz (70.2 kg)     Ht Readings from Last 3 Encounters:  06/12/22 5\' 5"  (1.651 m)  04/21/22 5\' 5"  (1.651 m)  03/10/21 5\' 5"  (1.651 m)      General: The patient is awake, alert and appears not in acute distress. The patient is well groomed. Head: Normocephalic, atraumatic. Neck is supple. Mallampati 2, elongated uvula, but not reddened.   neck circumference:16 inches . Nasal airflow is barely patent.   Retrognathia is mild.  Dental status: intact-no barces, no retainers.  Cardiovascular:  Regular rate and cardiac rhythm by pulse,  without distended neck veins. Respiratory: Lungs are clear to auscultation.  Skin:  Without evidence of ankle edema, or rash. Trunk: The patient's posture is erect.   Neurologic exam : The patient is awake and alert, oriented to place and time.   Memory subjective described as intact.  Attention span & concentration ability appears normal.  Speech is fluent,  without  dysarthria, dysphonia or aphasia.  Mood and affect are appropriate.   Cranial nerves: no loss of smell or taste reported -  Pupils are equal and briskly reactive to light. Funduscopic exam deferred.  Extraocular movements in vertical and horizontal planes were intact and without nystagmus. No Diplopia. Visual fields by finger perimetry are intact. Hearing was intact to soft voice and finger rubbing.  Facial sensation intact to fine touch. Facial motor strength is symmetric and tongue and uvula move midline.  Neck ROM : rotation, tilt and flexion extension were normal for age and  shoulder shrug was symmetrical.    Motor exam:  Symmetric bulk, tone and ROM.   Normal tone without cog-wheeling, symmetric grip strength .   Sensory:  Fine touch, pinprick and vibration were tested and normal.  She reports numbness in the left hand when sleeping on a  flexed elbow. Middle and Ringfinger on the left- and pinky and Ringfinger on the right.  Proprioception tested in the upper extremities was normal.   Coordination: Rapid alternating movements in the fingers/hands were of normal speed.  The Finger-to-nose maneuver was intact without evidence of ataxia, dysmetria or tremor.   Gait and station: Patient could rise unassisted from a seated position, walked without assistive device.  Stance is of normal width/ base and the patient turned with 3 steps.  Toe and heel walk were deferred.  Deep tendon reflexes: in the  upper and lower extremities are symmetric and intact.  Babinski response was deferred- all other DTR  normal    I have seen this patient in a SLEEP MEDICINE CLINIC but found myself r today requested as a general Neurologist to address occipital neuralgia.  After spending a total time of 29 minutes face to face and additional time for ED report,  physical and neurologic examination, review of laboratory studies,  personal review of imaging studies, reports and results of other testing and review of referral information / records as far as provided in visit, I have established the following assessments:  1) patient with mid DDD cervical spine, occipital neuralgia right neck, above the level of C 4.  Relief with nerve block injection, but today pain free, slightly tension over the occipital right notch.  I recommend to continue PT per PCP, and use an NSAID as needed. Muscle relaxation by baclofen at night.   2) Mrs. Yackel has no longer migraines or morning headaches , reported and witnessed snoring and a feeling of not being restored with less than 6- hours of sleep .   She has implemented all sleep hygiene rules. Keeping routines.  She is a former night shift Financial controller. She tested negative for apnea.     My Plan is to proceed with:  I recommend to continue PT per PCP, and use an NSAID as needed. Muscle relaxation by baclofen at night.      I would like to thank [facility]  [address] [place],  [place] [address] for allowing me to meet with and to take care of this pleasant patient.    .  Electronically signed by: Melvyn Novas, MD 06/12/2022 2:33 PM  [facility] Board certified by The [employer] of Sleep Medicine and Diplomate of the [employer] of Sleep Medicine. Board certified In Neurology through the ABPN, Fellow of the [employer] of Neurology. Medical Director of [facility].

## 2022-06-12 NOTE — Patient Instructions (Signed)
Baclofen Tablets What is this medication? BACLOFEN (BAK loe fen) treats muscle spasms. It works by relaxing your muscles, which reduces muscle stiffness. It belongs to a group of medications called muscle relaxants. This medicine may be used for other purposes; ask your health care provider or pharmacist if you have questions. COMMON BRAND NAME(S): ED Baclofen, Lioresal What should I tell my care team before I take this medication? They need to know if you have any of these conditions: High blood pressure History of stroke Kidney disease Mental health disease Ovarian cysts Seizures An unusual or allergic reaction to baclofen, other medications, foods, dyes or preservatives Pregnant or trying to get pregnant Breast-feeding How should I use this medication? Take this medication by mouth. Take it as directed on the prescription label. Keep taking it unless your care team tells you to stop. Stopping it too quickly can cause serious side effects. It can also make your condition worse. Talk to your care team about the use of this medication in children. While it may be prescribed for children as young as 12 years for selected conditions, precautions do apply. Overdosage: If you think you have taken too much of this medicine contact a poison control center or emergency room at once. NOTE: This medicine is only for you. Do not share this medicine with others. What if I miss a dose? If you miss a dose, take it as soon as you can. If it is almost time for your next dose, take only that dose. Do not take double or extra doses. What may interact with this medication? Do not take this medication with any of the following: Narcotic medications for cough This medication may also interact with the following: Alcohol Antihistamines for allergy, cough and cold Certain medications for anxiety or sleep Certain medications for depression like amitriptyline, fluoxetine, sertraline Certain medications for  seizures like phenobarbital, primidone General anesthetics like halothane, isoflurane, methoxyflurane, propofol Narcotic medications for pain Other medications that relax muscles Phenothiazines like chlorpromazine, mesoridazine, prochlorperazine, thioridazine This list may not describe all possible interactions. Give your health care provider a list of all the medicines, herbs, non-prescription drugs, or dietary supplements you use. Also tell them if you smoke, drink alcohol, or use illegal drugs. Some items may interact with your medicine. What should I watch for while using this medication? Visit your care team for regular checks on your progress. Tell your care team if your symptoms do not start to get better or if they get worse. Do not suddenly stop taking this medication. You may develop a severe reaction. Your care team will tell you how much medication to take. If your care team wants you to stop the medication, the dose may be slowly lowered over time to avoid any side effects. You may get drowsy or dizzy. Do not drive, use machinery, or do anything that needs mental alertness until you know how this medication affects you. Do not stand up or sit up quickly, especially if you are an older patient. This reduces the risk of dizzy or fainting spells. Alcohol may interfere with the effect of this medication. Avoid alcoholic drinks. If you take other medications that also cause drowsiness such as other narcotic pain medications, benzodiazepines, or other medications for sleep, you may have more side effects. Give your care team a list of all medications you use. They will tell you how much medication to take. Do not take more medication than directed. Get emergency help right away if you have trouble  breathing or are unusually tired or sleepy. What side effects may I notice from receiving this medication? Side effects that you should report to your care team as soon as possible: Allergic  reactions--skin rash, itching, hives, swelling of the face, lips, tongue, or throat CNS depression--slow or shallow breathing, shortness of breath, feeling faint, dizziness, confusion, trouble staying awake Side effects that usually do not require medical attention (report to your care team if they continue or are bothersome): Dizziness Drowsiness Headache Muscle weakness Nausea This list may not describe all possible side effects. Call your doctor for medical advice about side effects. You may report side effects to FDA at 1-800-FDA-1088. Where should I keep my medication? Keep out of the reach of children and pets. Store at room temperature between 20 and 25 degrees C (68 and 77 degrees F). Keep container tightly closed. Get rid of any unused medication after the expiration date. To get rid of medications that are no longer needed or have expired: Take the medication to a medication take-back program. Check with your pharmacy or law enforcement to find a location. If you cannot return the medication, check the label or package insert to see if the medication should be thrown out in the garbage or flushed down the toilet. If you are not sure, ask your care team. If it is safe to put it in the trash, take the medication out of the container. Mix the medication with cat litter, dirt, coffee grounds, or other unwanted substance. Seal the mixture in a bag or container. Put it in the trash. NOTE: This sheet is a summary. It may not cover all possible information. If you have questions about this medicine, talk to your doctor, pharmacist, or health care provider.  2023 Elsevier/Gold Standard (2021-02-09 00:00:00) Occipital Neuralgia  Occipital neuralgia is a type of headache that causes brief episodes of very bad pain in the back of the head. Pain from occipital neuralgia may spread (radiate) to other parts of the head. These headaches may be caused by irritation of the nerves that leave the spinal  cord high up in the neck, just below the base of the skull (occipital nerves). The occipital nerves transmit sensations from the back of the head, the top of the head, and the areas behind the ears. What are the causes? This condition can occur without any known cause (primary headache syndrome). In other cases, this condition is caused by pressure on or irritation of one of the two occipital nerves. Pressure and irritation may be due to: Muscle spasm in the neck. Neck injury. Wear and tear of the vertebrae in the neck (osteoarthritis). Disease of the disks that separate the vertebrae. Swollen blood vessels that put pressure on the occipital nerves. Infections. Tumors. Diabetes. What are the signs or symptoms? This condition causes brief burning, stabbing, electric, shocking, or shooting pain in the back of the head that can radiate to the top of the head. It can happen on one side or both sides of the head. It can also cause: Pain behind the eye. Pain triggered by neck movement or hair brushing. Scalp tenderness. Aching in the back of the head between episodes of very bad pain. Pain that gets worse with exposure to bright lights. How is this diagnosed? Your health care provider may diagnose the condition based on a physical exam and your symptoms. Tests may be done, such as: Imaging studies of the brain and neck (cervical spine), such as an MRI or CT scan. These  look for causes of pinched nerves. Applying pressure to the nerves in the neck to try to re-create the pain. Injection of numbing medicine into the occipital nerve areas to see if pain goes away (diagnostic nerve block). How is this treated? Treatment for this condition may begin with simple measures, such as: Rest. Massage. Applying heat or cold to the area. Over-the-counter pain relievers. If these measures do not work, you may need other treatments, including: Medicines, such as: Prescription-strength anti-inflammatory  medicines. Muscle relaxants. Anti-seizure medicines, which can relieve pain. Antidepressants, which can relieve pain. Injected medicines, such as medicines that numb the area (local anesthetic) and steroids. Pulsed radiofrequency ablation. This is when wires are implanted to deliver electrical impulses that block pain signals from the occipital nerve. Surgery to relieve nerve pressure. Physical therapy. Follow these instructions at home: Managing pain     Avoid any activities that cause pain. Rest when you have an attack of pain. Try gentle massage to relieve pain. Try a different pillow or sleeping position. If directed, apply heat to the affected area as often as told by your health care provider. Use the heat source that your health care provider recommends, such as a moist heat pack or a heating pad. Place a towel between your skin and the heat source. Leave the heat on for 20-30 minutes. Remove the heat if your skin turns bright red. This is especially important if you are unable to feel pain, heat, or cold. You have a greater risk of getting burned. If directed, put ice on the back of your head and neck area. To do this: Put ice in a plastic bag. Place a towel between your skin and the bag. Leave the ice on for 20 minutes, 2-3 times a day. Remove the ice if your skin turns bright red. This is very important. If you cannot feel pain, heat, or cold, you have a greater risk of damage to the area. General instructions Take over-the-counter and prescription medicines only as told by your health care provider. Avoid things that make your symptoms worse, such as bright lights. Try to stay active. Get regular exercise that does not cause pain. Ask your health care provider to suggest safe exercises for you. Work with a physical therapist to learn stretching exercises you can do at home. Practice good posture. Keep all follow-up visits. This is important. Contact a health care provider  if: Your medicine is not working. You have new or worsening symptoms. Get help right away if: You have very bad head pain that does not go away. You have a sudden change in vision, balance, or speech. These symptoms may represent a serious problem that is an emergency. Do not wait to see if the symptoms will go away. Get medical help right away. Call your local emergency services (911 in the [place] Do not drive yourself to the hospital. Summary Occipital neuralgia is a type of headache that causes brief episodes of very bad pain in the back of the head. Pain from occipital neuralgia may spread (radiate) to other parts of the head. Treatment for this condition includes rest, massage, and medicines. This information is not intended to replace advice given to you by your health care provider. Make sure you discuss any questions you have with your health care provider. Document Revised: 09/04/2020 Document Reviewed: 09/04/2020 Elsevier Patient Education  2023 [employer].

## 2023-01-25 ENCOUNTER — Emergency Department (HOSPITAL_BASED_OUTPATIENT_CLINIC_OR_DEPARTMENT_OTHER): Payer: Medicaid Other | Admitting: Radiology

## 2023-01-25 ENCOUNTER — Encounter (HOSPITAL_BASED_OUTPATIENT_CLINIC_OR_DEPARTMENT_OTHER): Payer: Self-pay | Admitting: Emergency Medicine

## 2023-01-25 ENCOUNTER — Emergency Department (HOSPITAL_BASED_OUTPATIENT_CLINIC_OR_DEPARTMENT_OTHER): Payer: Medicaid Other

## 2023-01-25 ENCOUNTER — Other Ambulatory Visit: Payer: Self-pay

## 2023-01-25 ENCOUNTER — Emergency Department (HOSPITAL_BASED_OUTPATIENT_CLINIC_OR_DEPARTMENT_OTHER)
Admission: EM | Admit: 2023-01-25 | Discharge: 2023-01-25 | Disposition: A | Discharge status: Home / Self Care | Payer: Medicaid Other | Attending: Emergency Medicine | Admitting: Emergency Medicine

## 2023-01-25 DIAGNOSIS — Z20822 Contact with and (suspected) exposure to covid-19: Secondary | ICD-10-CM | POA: Insufficient documentation

## 2023-01-25 DIAGNOSIS — R0602 Shortness of breath: Secondary | ICD-10-CM | POA: Insufficient documentation

## 2023-01-25 DIAGNOSIS — R0781 Pleurodynia: Secondary | ICD-10-CM

## 2023-01-25 DIAGNOSIS — R791 Abnormal coagulation profile: Secondary | ICD-10-CM | POA: Insufficient documentation

## 2023-01-25 LAB — BASIC METABOLIC PANEL
Anion gap: 10 (ref 5–15)
BUN: 9 mg/dL (ref 6–20)
CO2: 25 mmol/L (ref 22–32)
Calcium: 9.6 mg/dL (ref 8.9–10.3)
Chloride: 102 mmol/L (ref 98–111)
Creatinine, Ser: 0.73 mg/dL (ref 0.44–1.00)
GFR, Estimated: >60 mL/min (ref >60)
Glucose, Bld: 87 mg/dL (ref 70–99)
Potassium: 4.3 mmol/L (ref 3.5–5.1)
Sodium: 137 mmol/L (ref 135–145)

## 2023-01-25 LAB — RESP PANEL BY RT-PCR (RSV, FLU A&B, COVID)  RVPGX2
Influenza A by PCR: NEGATIVE
Influenza B by PCR: NEGATIVE
Resp Syncytial Virus by PCR: NEGATIVE
SARS Coronavirus 2 by RT PCR: NEGATIVE

## 2023-01-25 LAB — CBC
HCT: 40 % (ref 36.0–46.0)
Hemoglobin: 13 g/dL (ref 12.0–15.0)
MCH: 28.2 pg (ref 26.0–34.0)
MCHC: 32.5 g/dL (ref 30.0–36.0)
MCV: 86.8 fL (ref 80.0–100.0)
Platelets: 236 10*3/uL (ref 150–400)
RBC: 4.61 MIL/uL (ref 3.87–5.11)
RDW: 13.9 % (ref 11.5–15.5)
WBC: 7.6 10*3/uL (ref 4.0–10.5)
nRBC: 0 % (ref 0.0–0.2)

## 2023-01-25 LAB — D-DIMER, QUANTITATIVE: D-Dimer, Quant: 0.65 ug/mL-FEU — ABNORMAL HIGH (ref 0.00–0.50)

## 2023-01-25 LAB — TROPONIN I (HIGH SENSITIVITY): Troponin I (High Sensitivity): 2 ng/L (ref <18)

## 2023-01-25 MED ORDER — KETOROLAC TROMETHAMINE 30 MG/ML IJ SOLN
30.0000 mg | Freq: Once | INTRAMUSCULAR | Status: AC
  Administered 2023-01-25: 30 mg via INTRAVENOUS
  Filled 2023-01-25: qty 1

## 2023-01-25 MED ORDER — ALUM & MAG HYDROXIDE-SIMETH 200-200-20 MG/5ML PO SUSP
30.0000 mL | Freq: Once | ORAL | Status: AC
  Administered 2023-01-25: 30 mL via ORAL
  Filled 2023-01-25: qty 30

## 2023-01-25 MED ORDER — FAMOTIDINE 20 MG PO TABS
20.0000 mg | ORAL_TABLET | Freq: Once | ORAL | Status: AC
  Administered 2023-01-25: 20 mg via ORAL
  Filled 2023-01-25: qty 1

## 2023-01-25 MED ORDER — IOHEXOL 350 MG/ML SOLN
100.0000 mL | Freq: Once | INTRAVENOUS | Status: AC | PRN
Start: 2023-01-25 — End: 2023-01-25
  Administered 2023-01-25: 75 mL via INTRAVENOUS

## 2023-01-25 NOTE — Discharge Instructions (Signed)
You were diagnosed with chest pain today.  This is a non-specific diagnosis, but your provider did not feel this was a life-threatening condition at this time.  Chest pain is a common presenting condition in the Emergency Department, and it is not uncommon for patients to leave without a specific diagnosis.  It is important to remember that your workup today was not a complete medical workup.  You may still have a serious medical attention that needs follow up care with a specialist. If your provider referred you to see a specialist, it is VERY important that you call to set up an appointment with them.    It is also important that you speak to your primary care provider in 1-2 days after this visit to the ER.  Your PCP may want to see you in the office, or else they may want you to follow up with the specialist.  SEEK IMMEDIATE MEDICAL ATTENTION IF:  You have severe chest pain, especially if the pain is crushing or pressure-like and spreads to the arms, back, neck, or jaw, or if you have sweating, nausea (feeling sick to your stomach), or shortness of breath. THIS IS AN EMERGENCY. Don't wait to see if the pain will go away. Get medical help at once. Call 911 or 0 (operator). DO NOT drive yourself to the hospital.   Your chest pain gets worse and does not go away with rest.  You have an attack of chest pain lasting longer than usual, despite rest and treatment with the medications your caregiver has prescribed.  You wake from sleep with chest pain or shortness of breath.  You feel dizzy or faint.  You have chest pain not typical of your usual pain for which you originally saw your caregiver.  

## 2023-01-25 NOTE — ED Notes (Signed)
Patient transported to CT 

## 2023-01-25 NOTE — ED Triage Notes (Signed)
Pt arrives to ED with c/o SOB that started yesterday. She notes it hurts to take deep breathes. She notes "it feels like I am floating in water."

## 2023-01-25 NOTE — ED Provider Notes (Signed)
[facility] Provider Note   CSN: [id number] Arrival date & time: 01/25/23  1244     History  Chief Complaint  Patient presents with   Shortness of Breath    Jocelyn Townsend is a 42 y.o. female presented emergency department complaint of shortness of breath and "swimmy headedness".  Patient reports that he had abrupt onset of the symptoms yesterday.  She has a central substernal pressure sensation and says it hurts to take a deep inspiration.  She also feels "swimmy headed like I am underwater".  She denies coughing, fevers, chills, diarrhea, sore throat.  She denies history of DVT or PE.  She says she sometimes has intermittent pains in her left leg from the knee radiating into the calf but has not had any recently.  HPI     Home Medications Prior to Admission medications   Medication Sig Start Date End Date Taking? Authorizing Provider  baclofen (LIORESAL) 10 MG tablet Take 0.5-1 tablets (5-10 mg total) by mouth at bedtime as needed for muscle spasms. 06/12/22   Dohmeier, Asencion Partridge, MD  cholecalciferol (VITAMIN D) 1000 UNITS tablet Take 1,000 Units by mouth daily.    [provider]  glucosamine-chondroitin 500-400 MG tablet Take 1 tablet by mouth 3 (three) times daily.    [provider]  ibuprofen (ADVIL) 800 MG tablet Take 1 tablet (800 mg total) by mouth daily. 01/24/21   Hazel Sams, PA-C  Multiple Vitamin (MULTIVITAMIN) capsule Take by mouth.    [provider]  UNABLE TO FIND Med Name: Move Free Plus MSM-Vit D3 750 mg-100 mg-25 mcg oral tablet    [provider]      Allergies    Nitrofurantoin monohyd macro    Review of Systems   Review of Systems  Physical Exam Updated Vital Signs BP (!) 142/90   Pulse 68   Temp 98.1 F (36.7 C) (Oral)   Resp 16   Ht '5\' 5"'$  (1.651 m)   Wt 72.6 kg   SpO2 100%   BMI 26.63 kg/m  Physical Exam Constitutional:      General: She is not in acute  distress. HENT:     Head: Normocephalic and atraumatic.  Eyes:     Conjunctiva/sclera: Conjunctivae normal.     Pupils: Pupils are equal, round, and reactive to light.  Cardiovascular:     Rate and Rhythm: Normal rate and regular rhythm.  Pulmonary:     Effort: Pulmonary effort is normal. No respiratory distress.     Breath sounds: Normal breath sounds.  Abdominal:     General: There is no distension.     Tenderness: There is no abdominal tenderness.  Musculoskeletal:        General: Normal range of motion.  Skin:    General: Skin is warm and dry.  Neurological:     General: No focal deficit present.     Mental Status: She is alert. Mental status is at baseline.  Psychiatric:        Mood and Affect: Mood normal.        Behavior: Behavior normal.     ED Results / Procedures / Treatments   Labs (all labs ordered are listed, but only abnormal results are displayed) Labs Reviewed  D-DIMER, QUANTITATIVE - Abnormal; Notable for the following components:      Result Value   D-Dimer, Quant 0.65 (*)    All other components within normal limits  RESP PANEL BY RT-PCR (RSV,  FLU A&B, COVID)  RVPGX2  CBC  BASIC METABOLIC PANEL  TROPONIN I (HIGH SENSITIVITY)    EKG EKG Interpretation  Date/Time:  Friday January 25 2023 12:53:58 EST Ventricular Rate:  69 PR Interval:  141 QRS Duration: 80 QT Interval:  400 QTC Calculation: 429 R Axis:   64 Text Interpretation: Sinus rhythm Confirmed by Octaviano Glow [phone]) on 01/25/2023 1:58:22 PM  Radiology CT Angio Chest PE W and/or Wo Contrast  Result Date: 01/25/2023 CLINICAL DATA:  Pleuritic midsternal right-sided chest pain since yesterday. EXAM: CT ANGIOGRAPHY CHEST WITH CONTRAST TECHNIQUE: Multidetector CT imaging of the chest was performed using the standard protocol during bolus administration of intravenous contrast. Multiplanar CT image reconstructions and MIPs were obtained to evaluate the vascular anatomy. RADIATION DOSE REDUCTION:  This exam was performed according to the departmental dose-optimization program which includes automated exposure control, adjustment of the mA and/or kV according to patient size and/or use of iterative reconstruction technique. CONTRAST:  74m OMNIPAQUE IOHEXOL 350 MG/ML SOLN COMPARISON:  Chest x-ray 01/25/2023 earlier. FINDINGS: Cardiovascular: No pulmonary embolism identified. No pericardial effusion. The heart nonenlarged. Bovine type aortic arch. The aorta is nondilated. Mediastinum/Nodes: Normal caliber thoracic esophagus. Preserved thyroid gland. There are some calcified lymph nodes identified in the mediastinum and right hilum consistent with old granulomatous disease. Lungs/Pleura: Mild breathing motion. No consolidation, pneumothorax or effusion. Calcified bilateral lung nodules consistent with old granulomatous disease. Upper Abdomen: The adrenal glands in the upper abdomen are grossly preserved. Musculoskeletal: Mild degenerative changes along the spine. Review of the MIP images confirms the above findings. IMPRESSION: No pulmonary embolism identified. 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 Electronically Signed   By: AJill SideM.D.   On: 01/25/2023 14:51   DG Chest 2 View  Result Date: 01/25/2023 CLINICAL DATA:  Shortness of breath EXAM: CHEST - 2 VIEW COMPARISON:  CXR 11/25/16 FINDINGS: The heart size and mediastinal contours are within normal limits. Both lungs are clear. The visualized skeletal structures are unremarkable. IMPRESSION: No active cardiopulmonary disease. Electronically Signed   By: HMarin RobertsM.D.   On: 01/25/2023 13:06    Procedures Procedures    Medications Ordered in ED Medications  ketorolac (TORADOL) 30 MG/ML injection 30 mg (30 mg Intravenous Given 01/25/23 1450)  famotidine (PEPCID) tablet 20 mg (20 mg Oral Given 01/25/23 1450)  alum & mag hydroxide-simeth (MAALOX/MYLANTA) 200-200-20 MG/5ML suspension 30 mL (30 mLs Oral Given 01/25/23 1449)  iohexol (OMNIPAQUE) 350  MG/ML injection 100 mL (75 mLs Intravenous Contrast Given 01/25/23 1425)    ED Course/ Medical Decision Making/ A&P Clinical Course as of 01/25/23 1508  Fri Jan 25, 2023  1506 Patient is reassessed reports she has had some improvement of her symptoms with medications given she certainly does not feel as swimmy headed as when she came in.  I do not see any emergent findings on her workup.  She has a PCP appointment on Monday which is good follow-up.  I think she is reasonably stable for discharge at this time [MT]    Clinical Course User Index [MT] Advith Martine, MCarola Rhine MD                             Medical Decision Making Amount and/or Complexity of Data Reviewed Labs: ordered. Radiology: ordered.  Risk OTC drugs. Prescription drug management.   This patient presents to the ED with concern for shortness of breath, pleuritic chest pain, lightheadedness. This involves an extensive number  of treatment options, and is a complaint that carries with it a high risk of complications and morbidity.  The differential diagnosis includes anemia versus dehydration versus pulmonary embolism versus pneumonia versus viral URI versus other  This may also be reflux gastritis versus musculoskeletal pain.  I ordered and personally interpreted labs.  The pertinent results include: D-dimer mildly elevated.  Remainder of labs are unremarkable  I ordered imaging studies including x-ray of the chest, CT PE study I independently visualized and interpreted imaging which showed no emergent findings, calcified lymph nodes which is likely sequelae of chronic disease granulomatous I agree with the radiologist interpretation  The patient was maintained on a cardiac monitor.  I personally viewed and interpreted the cardiac monitored which showed an underlying rhythm of: Normal sinus rhythm  Per my interpretation the patient's ECG shows normal sinus rhythm no acute ischemic findings  I ordered medication including IV  Toradol, GI cocktail  I have reviewed the patients home medicines and have made adjustments as needed   After the interventions noted above, I reevaluated the patient and found that they have: improved   Dispostion:  After consideration of the diagnostic results and the patients response to treatment, I feel that the patent would benefit from close outpatient follow-up  At this time, I have a low suspicion for sepsis, CVA, ACS, PE, or other life-threatening infection or surgical emergency.         Final Clinical Impression(s) / ED Diagnoses Final diagnoses:  Pleuritic chest pain    Rx / DC Orders ED Discharge Orders     None         Keymoni Mccaster, Carola Rhine, MD 01/25/23 [phone]

## 2023-01-28 DIAGNOSIS — J302 Other seasonal allergic rhinitis: Secondary | ICD-10-CM | POA: Diagnosis not present

## 2023-01-28 DIAGNOSIS — Z713 Dietary counseling and surveillance: Secondary | ICD-10-CM | POA: Diagnosis not present

## 2023-01-28 DIAGNOSIS — Z0000 Encounter for general adult medical examination without abnormal findings: Secondary | ICD-10-CM | POA: Diagnosis not present

## 2023-01-28 DIAGNOSIS — Z1159 Encounter for screening for other viral diseases: Secondary | ICD-10-CM | POA: Diagnosis not present

## 2023-01-28 DIAGNOSIS — Z7182 Exercise counseling: Secondary | ICD-10-CM | POA: Diagnosis not present

## 2023-01-28 DIAGNOSIS — R002 Palpitations: Secondary | ICD-10-CM | POA: Diagnosis not present

## 2023-01-28 DIAGNOSIS — Z114 Encounter for screening for human immunodeficiency virus [HIV]: Secondary | ICD-10-CM | POA: Diagnosis not present

## 2023-01-28 DIAGNOSIS — N920 Excessive and frequent menstruation with regular cycle: Secondary | ICD-10-CM | POA: Diagnosis not present

## 2023-01-28 DIAGNOSIS — E559 Vitamin D deficiency, unspecified: Secondary | ICD-10-CM | POA: Diagnosis not present

## 2023-01-28 DIAGNOSIS — Z13220 Encounter for screening for lipoid disorders: Secondary | ICD-10-CM | POA: Diagnosis not present

## 2023-01-28 DIAGNOSIS — Z131 Encounter for screening for diabetes mellitus: Secondary | ICD-10-CM | POA: Diagnosis not present

## 2023-02-18 NOTE — Progress Notes (Signed)
Cardiology Office Note:    Date:  02/22/2023   ID:  Jocelyn Townsend, DOB 10-14-1981, MRN [id number]  PCP:  [facility]   [facility] Providers Cardiologist:  Christell Constant, MD     Referring MD: Quita Skye, PA-C   CC: Palpitations Consulted for the evaluation of palpitations at the behest of Ms. Darrow PA-C  History of Present Illness:    Jocelyn Townsend is a 42 y.o. female with a hx of palpitations.  Patient notes that she is feeling OK.   She notes that when has palpitations she has near syncope. Has had this for a few years.   Has rare chest pain that occurs randomly.  Center of her chest.  Felt like indigestion.  Occurred as rest. 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 No family history of CAD, though mother has heart condition NOS.  No shortness of breath; but has SOB 01/25/2023.  No PND or orthopnea.  No weight gain, leg swelling , or abdominal swelling.  ED visit in March has CTPE with prior granulomatous disease.  Gets palpitations twice a day.  Not usually every day.  They vary in intensity.  It has a squeezing in heart heart when she has palpitations.  Otherwise feels ok.  No history of pre-eclampsia, gestation HTN or gestational DM.     Past Medical History:  Diagnosis Date   Carpal tunnel syndrome    Intranasal nodule    Migraines    Occipital neuralgia    Palpitations    Prediabetes    Torticollis    Vitamin D deficiency     Past Surgical History:  Procedure Laterality Date   TUBAL LIGATION  2009    Current Medications: Current Meds  Medication Sig   B Complex-C (B-COMPLEX WITH VITAMIN C) tablet Take by mouth daily.   baclofen (LIORESAL) 10 MG tablet Take 0.5-1 tablets (5-10 mg total) by mouth at bedtime as needed for muscle spasms.   cetirizine (ZYRTEC) 10 MG tablet Take 1 tablet by mouth daily.   cholecalciferol (VITAMIN D) 1000 UNITS  tablet Take 1,000 Units by mouth daily.   ibuprofen (ADVIL) 800 MG tablet Take 1 tablet (800 mg total) by mouth daily. (Patient taking differently: Take 800 mg by mouth as needed for moderate pain or headache.)   Multiple Vitamin (MULTIVITAMIN) capsule Take by mouth daily.   UNABLE TO FIND daily after breakfast. Med Name: Move Free Plus MSM-Vit D3 750 mg-100 mg-25 mcg oral tablet     Allergies:   Nitrofurantoin monohyd macro   Social History   Socioeconomic History   Marital status: Significant Other    Spouse name: Not on file   Number of children: 2   Years of education: Not on file   Highest education level: High school graduate  Occupational History   Not on file  Tobacco Use   Smoking status: Former    Types: Cigarettes   Smokeless tobacco: Never  Vaping Use   Vaping Use: Never used  Substance and Sexual Activity   Alcohol use: Yes    Alcohol/week: 0.0 standard drinks of alcohol    Comment: occ   Drug use: Yes    Types: Marijuana    Comment: occ   Sexual activity: Yes    Partners: Male    Birth control/protection: Surgical  Other Topics Concern   Not on file  Social History Narrative   Lives at home  with children & her partner   Right handed   Caffeine: 2 C of coffee a day, but not all day.    Social Determinants of Health   Financial Resource Strain: Not on file  Food Insecurity: Not on file  Transportation Needs: Not on file  Physical Activity: Not on file  Stress: Not on file  Social Connections: Not on file     Family History: The patient's family history includes Breast cancer in her maternal grandmother and maternal great-grandmother. There is no history of Sleep apnea.  ROS:   Please see the history of present illness.     EKGs/Labs/Other Studies Reviewed:    The following studies were reviewed today:  EKG:  EKG is  ordered today.  The ekg ordered today demonstrates  01/27/22: SR rate 69     Recent Labs: 01/25/2023: BUN 9; Creatinine, Ser  0.73; Hemoglobin 13.0; Platelets 236; Potassium 4.3; Sodium 137  Recent Lipid Panel No results found for: "CHOL", "TRIG", "HDL", "CHOLHDL", "VLDL", "LDLCALC", "LDLDIRECT"      Physical Exam:    VS:  BP 120/70   Pulse 65   Ht 5\' 5"  (1.651 m)   Wt 170 lb (77.1 kg)   SpO2 99%   BMI 28.29 kg/m     Wt Readings from Last 3 Encounters:  02/22/23 170 lb (77.1 kg)  01/25/23 160 lb (72.6 kg)  06/12/22 156 lb (70.8 kg)    GEN:  Well nourished, well developed in no acute distress HEENT: Normal NECK: No JVD CARDIAC: RRR, no murmurs, rubs, gallops RESPIRATORY:  Clear to auscultation without rales, wheezing or rhonchi  ABDOMEN: Soft, non-tender, non-distended MUSCULOSKELETAL:  No edema; No deformity  SKIN: Warm and dry NEUROLOGIC:  Alert and oriented x 3 PSYCHIATRIC:  Normal affect   ASSESSMENT:    1. Palpitations   2. SOB (shortness of breath)   3. Family history of sarcoidosis   4. Granulomatous lung disease     PLAN:    Palpitations; possible SVT/PACs - will obtain 14-day non live heart monitor (ZioPatch)  SOB Prior granulomatous disease FHX of pulmonary sarcoidosis (mother) - her granulomatous disease may represent prior pulmoanry sarcoid - will get an echo - in the setting of arrhythmia and SOB, low threshold to get CMR  Atypical chest pain - no FHX of early CAD, no calcified vascular plaque, if persistent pain can get POET  Six months with our team unless evidence of cardiac sarcoidosis         Medication Adjustments/Labs and Tests Ordered: Current medicines are reviewed at length with the patient today.  Concerns regarding medicines are outlined above.  Orders Placed This Encounter  Procedures   LONG TERM MONITOR (3-14 DAYS)   ECHOCARDIOGRAM COMPLETE   No orders of the defined types were placed in this encounter.   Patient Instructions  Medication Instructions:  Your physician recommends that you continue on your current medications as directed. Please  refer to the Current Medication list given to you today.  *If you need a refill on your cardiac medications before your next appointment, please call your pharmacy*   Lab Work: NONE If you have labs (blood work) drawn today and your tests are completely normal, you will receive your results only by: MyChart Message (if you have MyChart) OR A paper copy in the mail If you have any lab test that is abnormal or we need to change your treatment, we will call you to review the results.   Testing/Procedures: Your physician  has requested that you have an echocardiogram. Echocardiography is a painless test that uses sound waves to create images of your heart. It provides your doctor with information about the size and shape of your heart and how well your heart's chambers and valves are working. This procedure takes approximately one hour. There are no restrictions for this procedure. Please do NOT wear cologne, perfume, aftershave, or lotions (deodorant is allowed). Please arrive 15 minutes prior to your appointment time.  Your physician has requested that you wear a heart monitor.    Follow-Up: At [facility], you and your health needs are our priority.  As part of our continuing mission to provide you with exceptional heart care, we have created designated Provider Care Teams.  These Care Teams include your primary Cardiologist (physician) and Advanced Practice Providers (APPs -  Physician Assistants and Nurse Practitioners) who all work together to provide you with the care you need, when you need it.  We recommend signing up for the patient portal called "MyChart".  Sign up information is provided on this After Visit Summary.  MyChart is used to connect with patients for Virtual Visits (Telemedicine).  Patients are able to view lab/test results, encounter notes, upcoming appointments, etc.  Non-urgent messages can be sent to your provider as well.   To learn more about what you can do  with MyChart, go to [email or web address].    Your next appointment:   6 month(s)  Provider:   Robin Searing, NP, Eligha Bridegroom, NP, or Tereso Newcomer, PA-C       Other Instructions Christena Deem- Long Term Monitor Instructions  Your physician has requested you wear a ZIO patch monitor for 14 days.  This is a single patch monitor. [employer] supplies one patch monitor per enrollment. Additional stickers are not available. Please do not apply patch if you will be having a Nuclear Stress Test,   Cardiac CT, MRI, or Chest Xray during the period you would be wearing the  monitor. The patch cannot be worn during these tests. You cannot remove and re-apply the  ZIO XT patch monitor.  Your ZIO patch monitor will be mailed 3 day USPS to your address on file. It may take 3-5 days  to receive your monitor after you have been enrolled.  Once you have received your monitor, please review the enclosed instructions. Your monitor  has already been registered assigning a specific monitor serial # to you.  Billing and Patient Assistance Program Information  We have supplied [employer] with any of your insurance information on file for billing purposes. [employer] offers a sliding scale Patient Assistance Program for patients that do not have  insurance, or whose insurance does not completely cover the cost of the ZIO monitor.  You must apply for the Patient Assistance Program to qualify for this discounted rate.  To apply, please call [employer] at [phone], select option 4, select option 2, ask to apply for  Patient Assistance Program. Meredeth Ide will ask your household income, and how many people  are in your household. They will quote your out-of-pocket cost based on that information.  [employer] will also be able to set up a 62-month, interest-free payment plan if needed.  Applying the monitor   Shave hair from upper left chest.  Hold abrader disc by orange tab. Rub abrader in 40 strokes over the upper left chest  as  indicated in your monitor instructions.  Clean area with 4 enclosed alcohol pads. Let dry.  Apply patch as  indicated in monitor instructions. Patch will be placed under collarbone on left  side of chest with arrow pointing upward.  Rub patch adhesive wings for 2 minutes. Remove white label marked "1". Remove the white  label marked "2". Rub patch adhesive wings for 2 additional minutes.  While looking in a mirror, press and release button in center of patch. A small green light will  flash 3-4 times. This will be your only indicator that the monitor has been turned on.  Do not shower for the first 24 hours. You may shower after the first 24 hours.  Press the button if you feel a symptom. You will hear a small click. Record Date, Time and  Symptom in the Patient Logbook.  When you are ready to remove the patch, follow instructions on the last 2 pages of Patient  Logbook. Stick patch monitor onto the last page of Patient Logbook.  Place Patient Logbook in the blue and white box. Use locking tab on box and tape box closed  securely. The blue and white box has prepaid postage on it. Please place it in the mailbox as  soon as possible. Your physician should have your test results approximately 7 days after the  monitor has been mailed back to [facility].  Call [facility] Customer Care at [phone] if you have questions regarding  your ZIO XT patch monitor. Call them immediately if you see an orange light blinking on your  monitor.  If your monitor falls off in less than 4 days, contact our Monitor department at [phone].  If your monitor becomes loose or falls off after 4 days call [employer] at [phone] for  suggestions on securing your monitor     Signed, Christell Constant, MD  02/22/2023 8:53 AM    [facility]

## 2023-02-22 ENCOUNTER — Ambulatory Visit: Payer: Medicaid Other | Attending: Internal Medicine

## 2023-02-22 ENCOUNTER — Ambulatory Visit: Payer: Medicaid Other | Attending: Internal Medicine | Admitting: Internal Medicine

## 2023-02-22 ENCOUNTER — Encounter: Payer: Self-pay | Admitting: Internal Medicine

## 2023-02-22 VITALS — BP 120/70 | HR 65 | Ht 65.0 in | Wt 170.0 lb

## 2023-02-22 DIAGNOSIS — J8410 Pulmonary fibrosis, unspecified: Secondary | ICD-10-CM | POA: Insufficient documentation

## 2023-02-22 DIAGNOSIS — R002 Palpitations: Secondary | ICD-10-CM

## 2023-02-22 DIAGNOSIS — Z832 Family history of diseases of the blood and blood-forming organs and certain disorders involving the immune mechanism: Secondary | ICD-10-CM

## 2023-02-22 DIAGNOSIS — R0602 Shortness of breath: Secondary | ICD-10-CM

## 2023-02-22 NOTE — Progress Notes (Unsigned)
Enrolled patient for a 14 day Zio XT  monitor to be mailed to patients home  °

## 2023-02-22 NOTE — Patient Instructions (Signed)
Medication Instructions:  Your physician recommends that you continue on your current medications as directed. Please refer to the Current Medication list given to you today.  *If you need a refill on your cardiac medications before your next appointment, please call your pharmacy*   Lab Work: NONE If you have labs (blood work) drawn today and your tests are completely normal, you will receive your results only by: MyChart Message (if you have MyChart) OR A paper copy in the mail If you have any lab test that is abnormal or we need to change your treatment, we will call you to review the results.   Testing/Procedures: Your physician has requested that you have an echocardiogram. Echocardiography is a painless test that uses sound waves to create images of your heart. It provides your doctor with information about the size and shape of your heart and how well your heart's chambers and valves are working. This procedure takes approximately one hour. There are no restrictions for this procedure. Please do NOT wear cologne, perfume, aftershave, or lotions (deodorant is allowed). Please arrive 15 minutes prior to your appointment time.  Your physician has requested that you wear a heart monitor.    Follow-Up: At [facility], you and your health needs are our priority.  As part of our continuing mission to provide you with exceptional heart care, we have created designated Provider Care Teams.  These Care Teams include your primary Cardiologist (physician) and Advanced Practice Providers (APPs -  Physician Assistants and Nurse Practitioners) who all work together to provide you with the care you need, when you need it.  We recommend signing up for the patient portal called "MyChart".  Sign up information is provided on this After Visit Summary.  MyChart is used to connect with patients for Virtual Visits (Telemedicine).  Patients are able to view lab/test results, encounter notes,  upcoming appointments, etc.  Non-urgent messages can be sent to your provider as well.   To learn more about what you can do with MyChart, go to [email or web address].    Your next appointment:   6 month(s)  Provider:   Robin SearingErnest Dick, NP, Eligha BridegroomMichelle Swinyer, NP, or Tereso NewcomerScott Weaver, PA-C       Other Instructions Christena DeemZIO XT- Long Term Monitor Instructions  Your physician has requested you wear a ZIO patch monitor for 14 days.  This is a single patch monitor. [employer] supplies one patch monitor per enrollment. Additional stickers are not available. Please do not apply patch if you will be having a Nuclear Stress Test,   Cardiac CT, MRI, or Chest Xray during the period you would be wearing the  monitor. The patch cannot be worn during these tests. You cannot remove and re-apply the  ZIO XT patch monitor.  Your ZIO patch monitor will be mailed 3 day USPS to your address on file. It may take 3-5 days  to receive your monitor after you have been enrolled.  Once you have received your monitor, please review the enclosed instructions. Your monitor  has already been registered assigning a specific monitor serial # to you.  Billing and Patient Assistance Program Information  We have supplied [employer] with any of your insurance information on file for billing purposes. [employer] offers a sliding scale Patient Assistance Program for patients that do not have  insurance, or whose insurance does not completely cover the cost of the ZIO monitor.  You must apply for the Patient Assistance Program to qualify for this discounted rate.  To  apply, please call [employer] at [phone], select option 4, select option 2, ask to apply for  Patient Assistance Program. Meredeth Ide will ask your household income, and how many people  are in your household. They will quote your out-of-pocket cost based on that information.  [employer] will also be able to set up a 38-month, interest-free payment plan if needed.  Applying the  monitor   Shave hair from upper left chest.  Hold abrader disc by orange tab. Rub abrader in 40 strokes over the upper left chest as  indicated in your monitor instructions.  Clean area with 4 enclosed alcohol pads. Let dry.  Apply patch as indicated in monitor instructions. Patch will be placed under collarbone on left  side of chest with arrow pointing upward.  Rub patch adhesive wings for 2 minutes. Remove white label marked "1". Remove the white  label marked "2". Rub patch adhesive wings for 2 additional minutes.  While looking in a mirror, press and release button in center of patch. A small green light will  flash 3-4 times. This will be your only indicator that the monitor has been turned on.  Do not shower for the first 24 hours. You may shower after the first 24 hours.  Press the button if you feel a symptom. You will hear a small click. Record Date, Time and  Symptom in the Patient Logbook.  When you are ready to remove the patch, follow instructions on the last 2 pages of Patient  Logbook. Stick patch monitor onto the last page of Patient Logbook.  Place Patient Logbook in the blue and white box. Use locking tab on box and tape box closed  securely. The blue and white box has prepaid postage on it. Please place it in the mailbox as  soon as possible. Your physician should have your test results approximately 7 days after the  monitor has been mailed back to [facility].  Call [facility] Customer Care at [phone] if you have questions regarding  your ZIO XT patch monitor. Call them immediately if you see an orange light blinking on your  monitor.  If your monitor falls off in less than 4 days, contact our Monitor department at [phone].  If your monitor becomes loose or falls off after 4 days call [employer] at [phone] for  suggestions on securing your monitor

## 2023-02-26 DIAGNOSIS — Z832 Family history of diseases of the blood and blood-forming organs and certain disorders involving the immune mechanism: Secondary | ICD-10-CM

## 2023-02-26 DIAGNOSIS — R002 Palpitations: Secondary | ICD-10-CM | POA: Diagnosis not present

## 2023-02-26 DIAGNOSIS — R0602 Shortness of breath: Secondary | ICD-10-CM

## 2023-03-15 ENCOUNTER — Ambulatory Visit (HOSPITAL_COMMUNITY): Payer: Medicaid Other | Attending: Cardiology

## 2023-03-15 DIAGNOSIS — R002 Palpitations: Secondary | ICD-10-CM

## 2023-03-15 DIAGNOSIS — R0602 Shortness of breath: Secondary | ICD-10-CM | POA: Insufficient documentation

## 2023-03-15 DIAGNOSIS — Z832 Family history of diseases of the blood and blood-forming organs and certain disorders involving the immune mechanism: Secondary | ICD-10-CM | POA: Insufficient documentation

## 2023-03-15 LAB — ECHOCARDIOGRAM COMPLETE
Area-P 1/2: 5.2 cm2
S' Lateral: 2.7 cm

## 2023-06-21 ENCOUNTER — Other Ambulatory Visit: Payer: Self-pay | Admitting: Physician Assistant

## 2023-06-21 DIAGNOSIS — Z1231 Encounter for screening mammogram for malignant neoplasm of breast: Secondary | ICD-10-CM

## 2023-07-10 ENCOUNTER — Ambulatory Visit
Admission: RE | Admit: 2023-07-10 | Discharge: 2023-07-10 | Disposition: A | Discharge status: Home / Self Care | Payer: Medicaid Other | Source: Ambulatory Visit | Attending: Physician Assistant | Admitting: Physician Assistant

## 2023-07-10 DIAGNOSIS — Z1231 Encounter for screening mammogram for malignant neoplasm of breast: Secondary | ICD-10-CM | POA: Diagnosis not present

## 2024-04-27 DIAGNOSIS — R7303 Prediabetes: Secondary | ICD-10-CM | POA: Diagnosis not present

## 2024-04-27 DIAGNOSIS — M19019 Primary osteoarthritis, unspecified shoulder: Secondary | ICD-10-CM | POA: Diagnosis not present

## 2024-04-27 DIAGNOSIS — Z83719 Family history of colon polyps, unspecified: Secondary | ICD-10-CM | POA: Diagnosis not present

## 2024-04-27 DIAGNOSIS — M489 Spondylopathy, unspecified: Secondary | ICD-10-CM | POA: Diagnosis not present

## 2024-04-27 DIAGNOSIS — Z0000 Encounter for general adult medical examination without abnormal findings: Secondary | ICD-10-CM | POA: Diagnosis not present

## 2024-04-27 DIAGNOSIS — J3489 Other specified disorders of nose and nasal sinuses: Secondary | ICD-10-CM | POA: Diagnosis not present

## 2024-04-27 DIAGNOSIS — E559 Vitamin D deficiency, unspecified: Secondary | ICD-10-CM | POA: Diagnosis not present

## 2024-05-07 DIAGNOSIS — J3489 Other specified disorders of nose and nasal sinuses: Secondary | ICD-10-CM | POA: Diagnosis not present

## 2024-05-19 DIAGNOSIS — Z83719 Family history of colon polyps, unspecified: Secondary | ICD-10-CM | POA: Diagnosis not present

## 2024-05-19 DIAGNOSIS — Z1211 Encounter for screening for malignant neoplasm of colon: Secondary | ICD-10-CM | POA: Diagnosis not present

## 2024-05-27 DIAGNOSIS — Z1211 Encounter for screening for malignant neoplasm of colon: Secondary | ICD-10-CM | POA: Diagnosis not present

## 2024-06-15 ENCOUNTER — Ambulatory Visit (HOSPITAL_COMMUNITY)
Admission: EM | Admit: 2024-06-15 | Discharge: 2024-06-15 | Disposition: A | Discharge status: Home / Self Care | Attending: Emergency Medicine | Admitting: Emergency Medicine

## 2024-06-15 ENCOUNTER — Ambulatory Visit (INDEPENDENT_AMBULATORY_CARE_PROVIDER_SITE_OTHER)

## 2024-06-15 DIAGNOSIS — R030 Elevated blood-pressure reading, without diagnosis of hypertension: Secondary | ICD-10-CM | POA: Diagnosis not present

## 2024-06-15 DIAGNOSIS — S63501A Unspecified sprain of right wrist, initial encounter: Secondary | ICD-10-CM | POA: Diagnosis not present

## 2024-06-15 DIAGNOSIS — M25531 Pain in right wrist: Secondary | ICD-10-CM | POA: Diagnosis not present

## 2024-06-15 NOTE — ED Notes (Signed)
 Patient presents to the office for right wrist//hand pain that started yesterday. 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

## 2024-06-15 NOTE — ED Provider Notes (Signed)
 [facility]    CSN: [id number] Arrival date & time: 06/15/24  1021      History   Chief Complaint No chief complaint on file.   HPI Jocelyn Townsend is a 43 y.o. female.   43 year old female, Jocelyn Townsend, presents to urgent care for evaluation of right wrist pain x 1 day. Pt states she was using a hedge clipper and felt weird sensation in right hand/wrist, now has swelling and pain with movement, is right hand dominant.   The history is provided by the patient. No language interpreter was used.    Past Medical History:  Diagnosis Date   Carpal tunnel syndrome    Intranasal nodule    Migraines    Occipital neuralgia    Palpitations    Prediabetes    Torticollis    Vitamin D deficiency     Patient Active Problem List   Diagnosis Date Noted   Right wrist pain 06/15/2024   Right wrist sprain, initial encounter 06/15/2024   Elevated blood pressure reading 06/15/2024   Family history of sarcoidosis 02/22/2023   SOB (shortness of breath) 02/22/2023   Palpitations 02/22/2023   Granulomatous lung disease (HCC) 02/22/2023   Occipital neuralgia of right side 06/12/2022   Hypersomnia, unspecified 04/19/2020   Loud snoring 04/19/2020   Sleep disorder, circadian, shift work type 04/19/2020   Well woman exam 10/20/2015   CIN III with severe dysplasia 08/19/2014    Past Surgical History:  Procedure Laterality Date   TUBAL LIGATION  2009    OB History     Gravida  4   Para  2   Term  2   Preterm      AB  2   Living  2      SAB  1   IAB  1   Ectopic      Multiple      Live Births  2            Home Medications    Prior to Admission medications   Medication Sig Start Date End Date Taking? Authorizing Provider  B Complex-C (B-COMPLEX WITH VITAMIN C) tablet Take by mouth daily. 03/07/21  Yes [provider]  baclofen  (LIORESAL ) 10 MG tablet Take 0.5-1 tablets (5-10 mg total) by mouth at bedtime as needed for muscle spasms.  06/12/22  Yes Dohmeier, Dedra, MD  cetirizine (ZYRTEC) 10 MG tablet Take 1 tablet by mouth daily. 01/28/23 06/15/24 Yes [provider]  cholecalciferol (VITAMIN D) 1000 UNITS tablet Take 1,000 Units by mouth daily.   Yes [provider]  ibuprofen  (ADVIL ) 800 MG tablet Take 1 tablet (800 mg total) by mouth daily. Patient taking differently: Take 800 mg by mouth as needed for moderate pain or headache. 01/24/21  Yes Graham, Laura E, PA-C  Multiple Vitamin (MULTIVITAMIN) capsule Take by mouth daily.   Yes [provider]  UNABLE TO FIND daily after breakfast. Med Name: Move Free Plus MSM-Vit D3 750 mg-100 mg-25 mcg oral tablet   Yes [provider]    Family History Family History  Problem Relation Age of Onset   Breast cancer Maternal Grandmother    Breast cancer Maternal Great-grandmother    Sleep apnea Neg Hx     Social History Social History   Tobacco Use   Smoking status: Former    Types: Cigarettes   Smokeless tobacco: Never  Vaping Use   Vaping status: Never Used  Substance Use Topics   Alcohol  use: Yes    Alcohol/week: 0.0 standard drinks of alcohol    Comment: occ   Drug use: Yes    Types: Marijuana    Comment: occ     Allergies   Nitrofurantoin monohyd macro   Review of Systems Review of Systems  Musculoskeletal:  Positive for arthralgias, joint swelling and myalgias.  Skin: Negative.   All other systems reviewed and are negative.    Physical Exam Triage Vital Signs ED Triage Vitals  Encounter Vitals Group     BP 06/15/24 1111 (!) 129/90     Girls Systolic BP Percentile --      Girls Diastolic BP Percentile --      Boys Systolic BP Percentile --      Boys Diastolic BP Percentile --      Pulse Rate 06/15/24 1110 61     Resp 06/15/24 1110 20     Temp 06/15/24 1110 98.8 F (37.1 C)     Temp Source 06/15/24 1110 Oral     SpO2 06/15/24 1110 95 %     Weight --      Height --      Head Circumference --      Peak Flow  --      Pain Score --      Pain Loc --      Pain Education --      Exclude from Growth Chart --    No data found.  Updated Vital Signs BP (!) 129/90   Pulse 61   Temp 98.8 F (37.1 C) (Oral)   Resp 20   LMP 05/25/2024 (Approximate)   SpO2 95%   Visual Acuity Right Eye Distance:   Left Eye Distance:   Bilateral Distance:    Right Eye Near:   Left Eye Near:    Bilateral Near:     Physical Exam Vitals and nursing note reviewed.  Musculoskeletal:     Right wrist: Swelling and bony tenderness present. Decreased range of motion. Normal pulse.  Neurological:     General: No focal deficit present.     Mental Status: She is alert and oriented to person, place, and time.     GCS: GCS eye subscore is 4. GCS verbal subscore is 5. GCS motor subscore is 6.     Sensory: Sensation is intact.     Motor: Motor function is intact.     Gait: Gait is intact.  Psychiatric:        Attention and Perception: Attention normal.        Mood and Affect: Mood normal.        Speech: Speech normal.        Behavior: Behavior normal.      UC Treatments / Results  Labs (all labs ordered are listed, but only abnormal results are displayed) Labs Reviewed - No data to display  EKG   Radiology DG Wrist Complete Right Result Date: 06/15/2024 CLINICAL DATA:  Right wrist and hand pain. EXAM: RIGHT WRIST - COMPLETE 3+ VIEW COMPARISON:  01/25/2021. FINDINGS: There is no evidence of fracture or dislocation. The carpal rows are intact and demonstrate normal alignment. The joint spaces are preserved. No significant soft tissue abnormalities are seen. IMPRESSION: No acute osseous abnormality. Electronically Signed   By: Harrietta Sherry M.D.   On: 06/15/2024 13:17    Procedures Procedures (including critical care time)  Medications Ordered in UC Medications - No data to display  Initial Impression / Assessment and Plan / UC  Course  I have reviewed the triage vital signs and the nursing  notes.  Pertinent labs & imaging results that were available during my care of the patient were reviewed by me and considered in my medical decision making (see chart for details).    Discussed exam findings and plan of care with patient, xray negative,ace wrap applied, strict go to ER precautions given.   Patient verbalized understanding to this provider.  Ddx: Right wrist pain, wrist sprain, elevated blood pressure reading Final Clinical Impressions(s) / UC Diagnoses   Final diagnoses:  Right wrist pain  Right wrist sprain, initial encounter  Elevated blood pressure reading     Discharge Instructions      Your xray was negative for acute findings(no fracture,no dislocation) May take tylenol  as label directed for pain RICE(rest,ice,wear ace wrap, elevate) Follow up with PCP,recheck BP or if symptoms persist, need referral to Ortho    ED Prescriptions   None    PDMP not reviewed this encounter.   Aminta Loose, NP 06/16/24 1156

## 2024-06-15 NOTE — Discharge Instructions (Signed)
 Your xray was negative for acute findings(no fracture,no dislocation) May take tylenol  as label directed for pain RICE(rest,ice,wear ace wrap, elevate) Follow up with PCP,recheck BP or if symptoms persist, need referral to Ortho

## 2024-06-16 DIAGNOSIS — S63501A Unspecified sprain of right wrist, initial encounter: Secondary | ICD-10-CM | POA: Diagnosis not present

## 2024-07-02 DIAGNOSIS — M25531 Pain in right wrist: Secondary | ICD-10-CM | POA: Diagnosis not present

## 2024-07-30 DIAGNOSIS — R202 Paresthesia of skin: Secondary | ICD-10-CM | POA: Diagnosis not present

## 2024-07-30 DIAGNOSIS — R200 Anesthesia of skin: Secondary | ICD-10-CM | POA: Diagnosis not present

## 2024-07-30 DIAGNOSIS — M65351 Trigger finger, right little finger: Secondary | ICD-10-CM | POA: Diagnosis not present

## 2024-07-30 DIAGNOSIS — M25531 Pain in right wrist: Secondary | ICD-10-CM | POA: Diagnosis not present

## 2024-09-10 DIAGNOSIS — M25531 Pain in right wrist: Secondary | ICD-10-CM | POA: Diagnosis not present

## 2024-09-10 DIAGNOSIS — M65351 Trigger finger, right little finger: Secondary | ICD-10-CM | POA: Diagnosis not present

## 2024-09-22 ENCOUNTER — Encounter (HOSPITAL_COMMUNITY): Payer: Self-pay | Admitting: *Deleted

## 2024-09-22 ENCOUNTER — Ambulatory Visit (HOSPITAL_COMMUNITY)
Admission: EM | Admit: 2024-09-22 | Discharge: 2024-09-22 | Disposition: A | Discharge status: Home / Self Care | Attending: Family Medicine | Admitting: Family Medicine

## 2024-09-22 ENCOUNTER — Ambulatory Visit (INDEPENDENT_AMBULATORY_CARE_PROVIDER_SITE_OTHER)

## 2024-09-22 DIAGNOSIS — M898X1 Other specified disorders of bone, shoulder: Secondary | ICD-10-CM | POA: Diagnosis not present

## 2024-09-22 DIAGNOSIS — M25512 Pain in left shoulder: Secondary | ICD-10-CM | POA: Diagnosis not present

## 2024-09-22 MED ORDER — DICLOFENAC SODIUM 75 MG PO TBEC: 75.0000 mg | DELAYED_RELEASE_TABLET | Freq: Two times a day (BID) | ORAL | 0 refills | Status: AC | PRN

## 2024-09-22 MED ORDER — IBUPROFEN 600 MG PO TABS: 600.0000 mg | ORAL_TABLET | Freq: Three times a day (TID) | ORAL | 0 refills | Status: DC | PRN

## 2024-09-22 NOTE — ED Triage Notes (Signed)
 Pt states that she has lift collar bone pain X 2 months she doesn't note a new injury but states she fell sometimes ago on the right side. She has been taking OTC pain relievers as needed. She states the collar bone is popping with movement.

## 2024-09-22 NOTE — ED Provider Notes (Signed)
 [facility]    CSN: [id number] Arrival date & time: 09/22/24  1219      History   Chief Complaint Chief Complaint  Patient presents with   Clavicle pain    HPI Jocelyn Townsend is a 43 y.o. female.   HPI Here for pain in her left proximal clavicle area.  That has been going on for about 2 months.  Then she has noticed popping in the area for about a month. No recent trauma.  About 2 years ago she slipped on a slick floor and fell onto her right arm and shoulder.  She did not note any pain in her left shoulder or arm or chest after that fall.  She is allergic to nitrofurantoin  Last menstrual cycle was October 13  Past Medical History:  Diagnosis Date   Carpal tunnel syndrome    Intranasal nodule    Migraines    Occipital neuralgia    Palpitations    Prediabetes    Torticollis    Vitamin D deficiency     Patient Active Problem List   Diagnosis Date Noted   Right wrist pain 06/15/2024   Right wrist sprain, initial encounter 06/15/2024   Elevated blood pressure reading 06/15/2024   Family history of sarcoidosis 02/22/2023   SOB (shortness of breath) 02/22/2023   Palpitations 02/22/2023   Granulomatous lung disease (HCC) 02/22/2023   Occipital neuralgia of right side 06/12/2022   Hypersomnia, unspecified 04/19/2020   Loud snoring 04/19/2020   Sleep disorder, circadian, shift work type 04/19/2020   Well woman exam 10/20/2015   CIN III with severe dysplasia 08/19/2014    Past Surgical History:  Procedure Laterality Date   TUBAL LIGATION  2009    OB History     Gravida  4   Para  2   Term  2   Preterm      AB  2   Living  2      SAB  1   IAB  1   Ectopic      Multiple      Live Births  2            Home Medications    Prior to Admission medications   Medication Sig Start Date End Date Taking? Authorizing Provider  cholecalciferol (VITAMIN D) 1000 UNITS tablet Take 1,000 Units by mouth daily.   Yes [provider]  diclofenac (VOLTAREN) 75 MG EC tablet Take 1 tablet (75 mg total) by mouth 2 (two) times daily as needed (pain). 09/22/24  Yes Cindi Ghazarian K, MD  Multiple Vitamin (MULTIVITAMIN) capsule Take by mouth daily.   Yes [provider]  UNABLE TO FIND daily after breakfast. Med Name: Move Free Plus MSM-Vit D3 750 mg-100 mg-25 mcg oral tablet   Yes [provider]  B Complex-C (B-COMPLEX WITH VITAMIN C) tablet Take by mouth daily. 03/07/21   [provider]  cetirizine (ZYRTEC) 10 MG tablet Take 1 tablet by mouth daily. 01/28/23 06/15/24  [provider]    Family History Family History  Problem Relation Age of Onset   Breast cancer Maternal Grandmother    Breast cancer Maternal Great-grandmother    Sleep apnea Neg Hx     Social History Social History   Tobacco Use   Smoking status: Former    Types: Cigarettes   Smokeless tobacco: Never  Vaping Use   Vaping status: Never Used  Substance Use Topics   Alcohol use: Yes  Alcohol/week: 0.0 standard drinks of alcohol    Comment: occ   Drug use: Yes    Types: Marijuana    Comment: occ     Allergies   Nitrofurantoin monohyd macro   Review of Systems Review of Systems   Physical Exam Triage Vital Signs ED Triage Vitals  Encounter Vitals Group     BP 09/22/24 1331 (!) 140/91     Girls Systolic BP Percentile --      Girls Diastolic BP Percentile --      Boys Systolic BP Percentile --      Boys Diastolic BP Percentile --      Pulse Rate 09/22/24 1331 88     Resp 09/22/24 1331 16     Temp 09/22/24 1331 98.6 F (37 C)     Temp Source 09/22/24 1331 Oral     SpO2 09/22/24 1331 98 %     Weight --      Height --      Head Circumference --      Peak Flow --      Pain Score 09/22/24 1330 3     Pain Loc --      Pain Education --      Exclude from Growth Chart --    No data found.  Updated Vital Signs BP (!) 140/91 (BP Location: Left Arm)   Pulse 88   Temp 98.6 F (37  C) (Oral)   Resp 16   LMP 08/31/2024 (Exact Date)   SpO2 98%   Visual Acuity Right Eye Distance:   Left Eye Distance:   Bilateral Distance:    Right Eye Near:   Left Eye Near:    Bilateral Near:     Physical Exam Vitals reviewed.  Constitutional:      General: She is not in acute distress.    Appearance: She is not ill-appearing, toxic-appearing or diaphoretic.  HENT:     Mouth/Throat:     Mouth: Mucous membranes are moist.  Musculoskeletal:     Comments: There is no asymmetry when comparing the 2 sternal clavicular joints.  No erythema or deformity overlying, except for possible hypertrophy of both of those joints.  No point tenderness and no bony step-off.  No crepitus on palpation   Skin:    Coloration: Skin is not jaundiced or pale.  Neurological:     General: No focal deficit present.     Mental Status: She is alert and oriented to person, place, and time.  Psychiatric:        Behavior: Behavior normal.      UC Treatments / Results  Labs (all labs ordered are listed, but only abnormal results are displayed) Labs Reviewed - No data to display  EKG   Radiology DG Clavicle Left Result Date: 09/22/2024 CLINICAL DATA:  Pain and crepitus in the proximal left clavicle for about 2 months. EXAM: LEFT CLAVICLE - 2+ VIEWS COMPARISON:  01/25/2023 FINDINGS: No acute fracture or dislocation. There is no evidence of arthropathy or other focal bone abnormality. Soft tissues are unremarkable. IMPRESSION: No acute fracture or dislocation. Electronically Signed   By: Rogelia Myers M.D.   On: 09/22/2024 14:29    Procedures Procedures (including critical care time)  Medications Ordered in UC Medications - No data to display  Initial Impression / Assessment and Plan / UC Course  I have reviewed the triage vital signs and the nursing notes.  Pertinent labs & imaging results that were available during my care of  the patient were reviewed by me and considered in my medical  decision making (see chart for details).     No abnormalities are seen on the x-rays.  Diclofenac is sent in for her to try as needed.  I have asked her to follow-up with her primary care Final Clinical Impressions(s) / UC Diagnoses   Final diagnoses:  Pain of left clavicle     Discharge Instructions      The x-rays did not show any bony abnormality.  Diclofenac 75 mg--1 tablet 2 times daily as needed for pain.  You can try this instead of the ibuprofen  if you would like.  Please follow-up with your primary care     ED Prescriptions     Medication Sig Dispense Auth. Provider   ibuprofen  (ADVIL ) 600 MG tablet  (Status: Discontinued) Take 1 tablet (600 mg total) by mouth every 8 (eight) hours as needed (pain). 15 tablet Matina Rodier K, MD   diclofenac (VOLTAREN) 75 MG EC tablet Take 1 tablet (75 mg total) by mouth 2 (two) times daily as needed (pain). 20 tablet Montgomery Rothlisberger K, MD      PDMP not reviewed this encounter.   Vonna Sharlet POUR, MD 09/22/24 [phone]

## 2024-09-22 NOTE — Discharge Instructions (Signed)
 The x-rays did not show any bony abnormality.  Diclofenac 75 mg--1 tablet 2 times daily as needed for pain.  You can try this instead of the ibuprofen  if you would like.  Please follow-up with your primary care

## 2024-10-28 DIAGNOSIS — G5601 Carpal tunnel syndrome, right upper limb: Secondary | ICD-10-CM | POA: Diagnosis not present
